# Patient Record
Sex: Female | Born: 1960 | Race: White | Hispanic: No | Smoking: Current every day smoker
Health system: Southern US, Community
[De-identification: ages and names within clinical notes are randomized; demographics above are authoritative.]

## PROBLEM LIST (undated history)

## (undated) DIAGNOSIS — J45909 Unspecified asthma, uncomplicated: Secondary | ICD-10-CM

## (undated) DIAGNOSIS — G4700 Insomnia, unspecified: Secondary | ICD-10-CM

## (undated) DIAGNOSIS — R609 Edema, unspecified: Secondary | ICD-10-CM

## (undated) DIAGNOSIS — J449 Chronic obstructive pulmonary disease, unspecified: Secondary | ICD-10-CM

## (undated) DIAGNOSIS — E119 Type 2 diabetes mellitus without complications: Secondary | ICD-10-CM

## (undated) DIAGNOSIS — B351 Tinea unguium: Secondary | ICD-10-CM

## (undated) DIAGNOSIS — F419 Anxiety disorder, unspecified: Secondary | ICD-10-CM

## (undated) DIAGNOSIS — E663 Overweight: Secondary | ICD-10-CM

## (undated) HISTORY — DX: Unspecified asthma, uncomplicated: J45.909

## (undated) HISTORY — PX: FEMUR FRACTURE SURGERY: SHX633

## (undated) HISTORY — DX: Edema, unspecified: R60.9

## (undated) HISTORY — DX: Chronic obstructive pulmonary disease, unspecified: J44.9

## (undated) HISTORY — DX: Anxiety disorder, unspecified: F41.9

## (undated) HISTORY — DX: Tinea unguium: B35.1

## (undated) HISTORY — DX: Insomnia, unspecified: G47.00

## (undated) HISTORY — DX: Type 2 diabetes mellitus without complications: E11.9

## (undated) HISTORY — DX: Overweight: E66.3

## (undated) HISTORY — PX: REPLACEMENT TOTAL KNEE: SUR1224

---

## 2013-12-16 DIAGNOSIS — J449 Chronic obstructive pulmonary disease, unspecified: Secondary | ICD-10-CM | POA: Insufficient documentation

## 2014-09-23 DIAGNOSIS — Z96649 Presence of unspecified artificial hip joint: Secondary | ICD-10-CM | POA: Insufficient documentation

## 2014-10-01 DIAGNOSIS — G894 Chronic pain syndrome: Secondary | ICD-10-CM | POA: Insufficient documentation

## 2014-10-01 DIAGNOSIS — F1911 Other psychoactive substance abuse, in remission: Secondary | ICD-10-CM | POA: Insufficient documentation

## 2015-08-02 DIAGNOSIS — M79604 Pain in right leg: Secondary | ICD-10-CM | POA: Insufficient documentation

## 2016-05-14 DIAGNOSIS — L03114 Cellulitis of left upper limb: Secondary | ICD-10-CM

## 2016-05-14 HISTORY — DX: Cellulitis of left upper limb: L03.114

## 2017-06-24 ENCOUNTER — Ambulatory Visit: Payer: BLUE CROSS/BLUE SHIELD | Admitting: Dietician

## 2018-02-11 ENCOUNTER — Other Ambulatory Visit: Payer: Self-pay | Admitting: Family Medicine

## 2018-02-11 DIAGNOSIS — Z1231 Encounter for screening mammogram for malignant neoplasm of breast: Secondary | ICD-10-CM

## 2018-09-04 ENCOUNTER — Other Ambulatory Visit: Payer: Self-pay | Admitting: Physician Assistant

## 2018-09-04 DIAGNOSIS — Z1231 Encounter for screening mammogram for malignant neoplasm of breast: Secondary | ICD-10-CM

## 2019-10-13 ENCOUNTER — Other Ambulatory Visit: Payer: Self-pay | Admitting: Physician Assistant

## 2019-10-13 ENCOUNTER — Other Ambulatory Visit (HOSPITAL_COMMUNITY): Payer: Self-pay | Admitting: Physician Assistant

## 2019-10-13 ENCOUNTER — Ambulatory Visit: Payer: 59 | Admitting: Physician Assistant

## 2019-10-13 ENCOUNTER — Other Ambulatory Visit: Payer: Self-pay

## 2019-10-13 VITALS — BP 110/74 | HR 94 | Temp 98.8°F | Ht 62.0 in | Wt 196.8 lb

## 2019-10-13 DIAGNOSIS — I872 Venous insufficiency (chronic) (peripheral): Secondary | ICD-10-CM

## 2019-10-13 DIAGNOSIS — M79661 Pain in right lower leg: Secondary | ICD-10-CM

## 2019-10-13 DIAGNOSIS — R600 Localized edema: Secondary | ICD-10-CM

## 2019-10-13 DIAGNOSIS — M79662 Pain in left lower leg: Secondary | ICD-10-CM

## 2019-10-13 NOTE — Progress Notes (Signed)
   Subjective:    Patient ID: Ellen Butler, female    DOB: 1961-03-30, 58 y.o.   MRN: [id number]  HPI    Review of Systems     Objective:   Physical Exam        Assessment & Plan:

## 2019-10-13 NOTE — Progress Notes (Signed)
Hx of bilateral leg swelling, worsening in the last few months., Left foot pain x couple weeks, sharp pain.No known injuries. Sharp pain in left ear comes and goes.

## 2019-10-13 NOTE — Progress Notes (Signed)
   Subjective: Bilateral lower leg pain    Patient ID: Ellen Butler, female    DOB: 1960/12/28, 58 y.o.   MRN: [id number]  HPI Patient presents with 3 months of increasing calf pain with standing and ambulation.  Patient denies shortness of breath or chest pain.  Patient also state there is increasing dry flaky skin on the lower extremities.  Patient denies redness.  Patient has not been seen in this facility for over a year.   Review of Systems Negative except for complaint.    Objective:   Physical Exam No acute distress.  Patient is overweight.  Examination of the lower extremities shows bilateral edema and flaky skin.  Patient has bilateral mild to moderate guarding palpation of the calf.  Peripheral pulses are intact but hard to palpate.       Assessment & Plan: Bilateral leg pain.  Venous insufficiency.  Discussed with patient  rationale for obtaining baseline labs due to no recent labs in 2 years.  Patient also will be consulted for bilateral lower extremity ultrasounds.  Patient will be scheduled for complete physical exam.

## 2019-10-20 ENCOUNTER — Other Ambulatory Visit: Payer: Self-pay

## 2019-10-20 ENCOUNTER — Ambulatory Visit
Admission: RE | Admit: 2019-10-20 | Discharge: 2019-10-20 | Disposition: A | Discharge status: Home / Self Care | Payer: Managed Care, Other (non HMO) | Source: Ambulatory Visit | Attending: Physician Assistant | Admitting: Physician Assistant

## 2019-10-20 DIAGNOSIS — M79662 Pain in left lower leg: Secondary | ICD-10-CM | POA: Insufficient documentation

## 2019-10-20 DIAGNOSIS — M79661 Pain in right lower leg: Secondary | ICD-10-CM | POA: Diagnosis present

## 2019-10-20 DIAGNOSIS — R600 Localized edema: Secondary | ICD-10-CM

## 2019-10-21 ENCOUNTER — Encounter: Payer: Self-pay | Admitting: Physician Assistant

## 2019-10-22 ENCOUNTER — Telehealth: Payer: Self-pay

## 2019-10-22 NOTE — Telephone Encounter (Signed)
Patient contacted via telephone.  Patient reported she made appt to see PA Katrinka Blazing due to worsening leg swelling of her legs and she had to switch from ankle socks to knee highs and socks are pushing swelling to her thighs now and definitely noticeable lumps under her clothing where socks end.  She does not watch her salt intake and typically doesn't drink very much water.  I discussed recommendations of knee to thigh high stockings which can be purchased at [employer]/[facility] or [employer], elevating feet after she is done at work for the day, drinking water to keep urine pale clear yellow tinged.  Monitor for rash, hot spot on legs and if worsening pain despite plan of care seek re-evaluation especially if rash/hot spot/dyspnea/SOB at rest/chest pain sooner than currently scheduled appt. Patient denied all of the above symptoms at this time only bilateral leg swelling which has been ongoing and worsening over the past year.   Discussed with patient BMI above recommended 25 and I do recommend weight loss.  Discussed venous ultrasound did not show varicose veins or blood clot.  Patient encouraged to keep her lab and follow up office visit appts as last labs drawn 2017 at [facility].  Patient last evaluated by PA Katrinka Blazing in office but I am on call today for [facility] and patient notified of such.  Patient agreed with plan of care and had no further questions at this time.  She verbalized understanding of information.

## 2019-10-22 NOTE — Telephone Encounter (Signed)
Durward Parcel, PA-C (Interim Provider) saw patient in office on 10/13/2019 for venous (peripheral) insufficiency. Ordered Lower Extremity Doppler Ultrasound.    LE Venous Doppler Ultrasound performed 10/20/2019 & the report says no evidence of DVT.  Patient contacted & wanted to know what now?  Sent message to Durward Parcel, PA-C & he recommended support stockings but didn't give any specifics.  Dewayne Hatch

## 2019-10-22 NOTE — Telephone Encounter (Signed)
Durward Parcel, PA-C (Interim Provider) recommended support stockings but didn't give any specifics.
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
## 2019-11-04 ENCOUNTER — Other Ambulatory Visit: Payer: Self-pay

## 2019-11-04 ENCOUNTER — Ambulatory Visit: Payer: 59

## 2019-11-04 DIAGNOSIS — Z0000 Encounter for general adult medical examination without abnormal findings: Secondary | ICD-10-CM

## 2019-11-04 LAB — POCT URINALYSIS DIPSTICK
Bilirubin, UA: NEGATIVE
Blood, UA: NEGATIVE
Glucose, UA: POSITIVE — AB
Ketones, UA: NEGATIVE
Leukocytes, UA: NEGATIVE
Nitrite, UA: NEGATIVE
Protein, UA: NEGATIVE
Spec Grav, UA: 1.025 (ref 1.010–1.025)
Urobilinogen, UA: 0.2 E.U./dL
pH, UA: 6 (ref 5.0–8.0)

## 2019-11-04 NOTE — Progress Notes (Signed)
Patient is here today for pre physical labs and EKG. Patient is scheduled for a physical with Laurie Lee, PA-C on 11/11/19. 

## 2019-11-05 LAB — CMP12+LP+TP+TSH+6AC+CBC/D/PLT
ALT: 22 IU/L (ref 0–32)
AST: 17 IU/L (ref 0–40)
Albumin/Globulin Ratio: 1.7 (ref 1.2–2.2)
Albumin: 4 g/dL (ref 3.8–4.9)
Alkaline Phosphatase: 115 IU/L (ref 39–117)
BUN/Creatinine Ratio: 20 (ref 9–23)
BUN: 13 mg/dL (ref 6–24)
Basophils Absolute: 0 10*3/uL (ref 0.0–0.2)
Basos: 1 %
Bilirubin Total: 0.2 mg/dL (ref 0.0–1.2)
Calcium: 9.3 mg/dL (ref 8.7–10.2)
Chloride: 100 mmol/L (ref 96–106)
Chol/HDL Ratio: 4.7 ratio — ABNORMAL HIGH (ref 0.0–4.4)
Cholesterol, Total: 179 mg/dL (ref 100–199)
Creatinine, Ser: 0.66 mg/dL (ref 0.57–1.00)
EOS (ABSOLUTE): 0.2 10*3/uL (ref 0.0–0.4)
Eos: 2 %
Estimated CHD Risk: 1.2 times avg. — ABNORMAL HIGH (ref 0.0–1.0)
Free Thyroxine Index: 1.7 (ref 1.2–4.9)
GFR calc Af Amer: 114 mL/min/{1.73_m2} (ref >59)
GFR calc non Af Amer: 99 mL/min/{1.73_m2} (ref >59)
GGT: 16 IU/L (ref 0–60)
Globulin, Total: 2.3 g/dL (ref 1.5–4.5)
Glucose: 288 mg/dL — ABNORMAL HIGH (ref 65–99)
HDL: 38 mg/dL — ABNORMAL LOW (ref >39)
Hematocrit: 40.7 % (ref 34.0–46.6)
Hemoglobin: 14.1 g/dL (ref 11.1–15.9)
Immature Grans (Abs): 0 10*3/uL (ref 0.0–0.1)
Immature Granulocytes: 0 %
Iron: 95 ug/dL (ref 27–159)
LDH: 191 IU/L (ref 119–226)
LDL Chol Calc (NIH): 103 mg/dL — ABNORMAL HIGH (ref 0–99)
Lymphocytes Absolute: 2.4 10*3/uL (ref 0.7–3.1)
Lymphs: 31 %
MCH: 31 pg (ref 26.6–33.0)
MCHC: 34.6 g/dL (ref 31.5–35.7)
MCV: 90 fL (ref 79–97)
Monocytes Absolute: 0.5 10*3/uL (ref 0.1–0.9)
Monocytes: 7 %
Neutrophils Absolute: 4.5 10*3/uL (ref 1.4–7.0)
Neutrophils: 59 %
Phosphorus: 3.5 mg/dL (ref 3.0–4.3)
Platelets: 243 10*3/uL (ref 150–450)
Potassium: 4.4 mmol/L (ref 3.5–5.2)
RBC: 4.55 x10E6/uL (ref 3.77–5.28)
RDW: 12.1 % (ref 11.7–15.4)
Sodium: 137 mmol/L (ref 134–144)
T3 Uptake Ratio: 27 % (ref 24–39)
T4, Total: 6.2 ug/dL (ref 4.5–12.0)
TSH: 2.56 u[IU]/mL (ref 0.450–4.500)
Total Protein: 6.3 g/dL (ref 6.0–8.5)
Triglycerides: 221 mg/dL — ABNORMAL HIGH (ref 0–149)
Uric Acid: 2.7 mg/dL — ABNORMAL LOW (ref 3.0–7.2)
VLDL Cholesterol Cal: 38 mg/dL (ref 5–40)
WBC: 7.8 10*3/uL (ref 3.4–10.8)

## 2019-11-07 LAB — HGB A1C W/O EAG: Hgb A1c MFr Bld: 9.6 % — ABNORMAL HIGH (ref 4.8–5.6)

## 2019-11-07 LAB — MICROALBUMIN / CREATININE URINE RATIO
Creatinine, Urine: 97.2 mg/dL
Microalb/Creat Ratio: 8 mg/g creat (ref 0–29)
Microalbumin, Urine: 7.7 ug/mL

## 2019-11-07 LAB — SPECIMEN STATUS REPORT

## 2019-11-11 ENCOUNTER — Other Ambulatory Visit: Payer: Self-pay

## 2019-11-11 ENCOUNTER — Ambulatory Visit: Payer: 59 | Admitting: Physician Assistant

## 2019-11-11 VITALS — BP 102/78 | HR 77 | Temp 98.1°F | Ht 62.0 in | Wt 200.0 lb

## 2019-11-11 DIAGNOSIS — F17200 Nicotine dependence, unspecified, uncomplicated: Secondary | ICD-10-CM

## 2019-11-11 DIAGNOSIS — Z0000 Encounter for general adult medical examination without abnormal findings: Secondary | ICD-10-CM

## 2019-11-11 DIAGNOSIS — R739 Hyperglycemia, unspecified: Secondary | ICD-10-CM

## 2019-11-11 NOTE — Progress Notes (Signed)
Subjective:    Patient ID: Ellen Butler, female    DOB: 1961/04/12, 59 y.o.   MRN: [id number]  HPI 59 yo F present for annual exam Husband died 8 years ago Covid restrictions- lonely  Has a dog she walks daily- only exercise Just met neighbor who also has dog--covid precautions but  Walk daily for comradery possibly  Smokes 1 ppd at least x many years- Has albuterol but hasn't used in years  Drinks "at least 10" Ghirardelli mocha chocolate drinks daily Cheesburgers from [employer]  Minimal fruits and vegies  Works at [employer] maybe 3-4 years Pap many many years ago  Hx drugs- Buprenorphine HCL-Naloxone HCL  5.7-1.4 mg subling  Lungs- has not used albuterol in years-probably need new Rx- check exp date  Glucose- 288- reported as fasting but with closer questioning she had a hot chocolate at 4 am and coffee with sweet creamer before reporting for labs   + glucosuria Hgb A1C   9.6  Triglycerides 221 HDL 38  LDL 103  Ratio 4.7  Elevated  Anxiety- states she has had Rx #30 Alprazolam  0.25 that has lasted "for a year"   Denies using Prozac anymore  Review of Systems Swelling lower legs Discomfort recently - better today per patient    Recent u/s no evidence of DVT bilat Objective:   Physical Exam Vitals and nursing note reviewed.  Constitutional:      Appearance: Normal appearance.     Comments: masked  HENT:     Head: Normocephalic and atraumatic.     Right Ear: Tympanic membrane normal.     Left Ear: Tympanic membrane normal.     Nose: Nose normal.     Mouth/Throat:     Mouth: Mucous membranes are moist.     Comments: Voice raspy No dental care Eyes:     Extraocular Movements: Extraocular movements intact.     Pupils: Pupils are equal, round, and reactive to light.     Comments: No eye exam in years- encouraged  Cardiovascular:     Rate and Rhythm: Normal rate and regular rhythm.     Pulses: Normal pulses.     Heart  sounds: Normal heart sounds.     Comments: EKG RSR Left axis - ant fascicular block Non-specific T wave -- low voltage  R/o pulmonary disease Pulmonary:     Effort: Pulmonary effort is normal. No respiratory distress.     Comments: Coarse breath sounds- Smokers voice Abdominal:     General: Abdomen is flat. Bowel sounds are normal.     Palpations: Abdomen is soft.     Tenderness: There is no abdominal tenderness.  Genitourinary:    Comments: Defer-recommend GYN routine visit Pap/pelvic Musculoskeletal:        General: Swelling present. Normal range of motion.     Cervical back: Normal range of motion.     Comments: Past hx dependant edema on record Today very mild 1 + bilat  Skin:    General: Skin is warm and dry.     Capillary Refill: Capillary refill takes less than 2 seconds.  Neurological:     General: No focal deficit present.     Mental Status: She is alert.  Psychiatric:        Mood and Affect: Mood normal.        Thought Content: Thought content normal.       Assessment & Plan:   RTC 2 weeks with repeat FBS  and A1C... true fast from midnight- please remind patient  Encourage walking plan with [place]-respecting masks and distance- 10 min 3 x day or 30 min  As preferred Use workplace parking lot and property for walking before home alone  Rec start q 6 mos DDS - job benefit no charge  Reduce tobacco- taught buy back method; half cig stop- Bring inhaler to next visit ck dates  Repeat EKG on RTC-  Dietary review and handouts- weight loss 1/2 ppwk goal- Check POCT A1C at follow up- if without change expect intervention indicated RX  Consider mammo order at follow up-attempterd to schedule1/19 - patient failed to f/u

## 2019-11-11 NOTE — Patient Instructions (Signed)
Smoking and Musculoskeletal Health Smoking is bad for your health. Most people know that smoking causes lung disease, heart disease, and cancer. But people may not realize that it also affects their bones, muscles, and joints (musculoskeletal system). When you smoke, the effects on your lungs and heart result in less oxygen for your musculoskeletal system. This can lead to poor bone and joint health. How can smoking affect my musculoskeletal health? Smoking can:  Increase your risk of having weak, thin bones (osteoporosis). Elderly smokers are at higher risk for bone fractures related to osteoporosis.  Decrease the ability of bone-forming cells to make and replace bone (in addition to reducing oxygen and blood flow).  Reduce your body's ability to absorb calcium from your diet. Less calcium means weaker bones.  Interfere with the breakdown of the female hormone estrogen. Smoking lowers estrogen, which is a hormone that helps keep bones strong. Women who smoke may have earlier menopause. Menopause is a risk factor for osteoporosis.  Weaken the tissues that attach bones to muscles (tendons). This can lead to shoulder, back, and other joint injuries.  Increase your risk of rheumatoid arthritis or make the condition worse if you already have it.  Slow down healing and increase your risk of infection and other complications if you have a bone fracture or surgery that involves your musculoskeletal system.  Make you get out of breath easily. This can keep you from getting the exercise you need to keep your bones and joints healthy.  Decrease your appetite and body mass. You may lose weight and muscle strength. This can put you at higher risk for muscle injury, joint injury, and broken bones. What actions can I take to prevent musculoskeletal problems? Quit smoking      Do not start smoking. Quit if you already do. Even stopping later in life can improve musculoskeletal health.  Do not use any  products that contain nicotine or tobacco. Do not replace cigarette smoking with e-cigarettes. The safety of e-cigarettes is not known, and some may contain harmful chemicals.  Make a plan to quit smoking and commit to it. Look for programs to help you, and ask your health care provider for recommendations and ideas.  Talk with your health care provider about using nicotine replacement medicines to help you quit, such as gum, lozenges, patches, sprays, or pills. Make other lifestyle changes   Eat a healthy diet that includes calcium and vitamin D. These nutrients are important for bone health. ? Calcium is found in dairy foods and green leafy vegetables. ? Vitamin D is found in eggs, fish, and liver. ? Many foods also have vitamin D and calcium added to them (are fortified). ? Ask your health care provider if you would benefit from taking a supplement.  Get out in the sunshine for a short time every day. This increases production of vitamin D.  Get 30 minutes of exercise at least 5 days a week. Weight-bearing and strength exercises are best for musculoskeletal health. Ask your health care provider what type of exercise is safe for you.  Do not drink alcohol if: ? Your health care provider tells you not to drink. ? You are pregnant, may be pregnant, or are planning to become pregnant.  If you drink alcohol, limit how much you have: ? 0-1 drink a day for women. ? 0-2 drinks a day for men.  Be aware of how much alcohol is in your drink. In the [place] one drink equals one 12 oz bottle   of beer (355 mL), one 5 oz glass of wine (148 mL), or one 1 oz glass of hard liquor (44 mL). Where to find more information You may find more information about smoking, musculoskeletal health, and quitting smoking from:  American Academy of Orthopaedic Surgeons: orthoinfo.aaos.org  [employer] of Health, Osteoporosis and Related Bone Diseases [employer]: bones.http://www.myers.net/  HelpGuide.org:  helpguide.org  BankR[employer]: smokefree.gov  American Lung Association: lung.org Contact a health care provider if:  You need help to quit smoking. Summary  When you smoke, the effects on your lungs and heart result in less oxygen for your musculoskeletal system.  Even stopping smoking later in life can improve musculoskeletal health.  Do not use any products that contain nicotine or tobacco, such as cigarettes and e-cigarettes.  If you need help quitting, ask your health care provider. This information is not intended to replace advice given to you by your health care provider. Make sure you discuss any questions you have with your health care provider. Document Revised: 06/25/2019 Document Reviewed: 01/26/2018 Elsevier Patient Education  2020 [employer]. Diabetes Mellitus and Nutrition, Adult When you have diabetes (diabetes mellitus), it is very important to have healthy eating habits because your blood sugar (glucose) levels are greatly affected by what you eat and drink. Eating healthy foods in the appropriate amounts, at about the same times every day, can help you:  Control your blood glucose.  Lower your risk of heart disease.  Improve your blood pressure.  Reach or maintain a healthy weight. Every person with diabetes is different, and each person has different needs for a meal plan. Your health care provider may recommend that you work with a diet and nutrition specialist (dietitian) to make a meal plan that is best for you. Your meal plan may vary depending on factors such as:  The calories you need.  The medicines you take.  Your weight.  Your blood glucose, blood pressure, and cholesterol levels.  Your activity level.  Other health conditions you have, such as heart or kidney disease. How do carbohydrates affect me? Carbohydrates, also called carbs, affect your blood glucose level more than any other type of food. Eating carbs naturally raises the amount of  glucose in your blood. Carb counting is a method for keeping track of how many carbs you eat. Counting carbs is important to keep your blood glucose at a healthy level, especially if you use insulin or take certain oral diabetes medicines. It is important to know how many carbs you can safely have in each meal. This is different for every person. Your dietitian can help you calculate how many carbs you should have at each meal and for each snack. Foods that contain carbs include:  Bread, cereal, rice, pasta, and crackers.  Potatoes and corn.  Peas, beans, and lentils.  Milk and yogurt.  Fruit and juice.  Desserts, such as cakes, cookies, ice cream, and candy. How does alcohol affect me? Alcohol can cause a sudden decrease in blood glucose (hypoglycemia), especially if you use insulin or take certain oral diabetes medicines. Hypoglycemia can be a life-threatening condition. Symptoms of hypoglycemia (sleepiness, dizziness, and confusion) are similar to symptoms of having too much alcohol. If your health care provider says that alcohol is safe for you, follow these guidelines:  Limit alcohol intake to no more than 1 drink per day for nonpregnant women and 2 drinks per day for men. One drink equals 12 oz of beer, 5 oz of wine, or 1  oz of hard liquor.  Do not drink on an empty stomach.  Keep yourself hydrated with water, diet soda, or unsweetened iced tea.  Keep in mind that regular soda, juice, and other mixers may contain a lot of sugar and must be counted as carbs. What are tips for following this plan?  Reading food labels  Start by checking the serving size on the "Nutrition Facts" label of packaged foods and drinks. The amount of calories, carbs, fats, and other nutrients listed on the label is based on one serving of the item. Many items contain more than one serving per package.  Check the total grams (g) of carbs in one serving. You can calculate the number of servings of carbs  in one serving by dividing the total carbs by 15. For example, if a food has 30 g of total carbs, it would be equal to 2 servings of carbs.  Check the number of grams (g) of saturated and trans fats in one serving. Choose foods that have low or no amount of these fats.  Check the number of milligrams (mg) of salt (sodium) in one serving. Most people should limit total sodium intake to less than 2,300 mg per day.  Always check the nutrition information of foods labeled as "low-fat" or "nonfat". These foods may be higher in added sugar or refined carbs and should be avoided.  Talk to your dietitian to identify your daily goals for nutrients listed on the label. Shopping  Avoid buying canned, premade, or processed foods. These foods tend to be high in fat, sodium, and added sugar.  Shop around the outside edge of the grocery store. This includes fresh fruits and vegetables, bulk grains, fresh meats, and fresh dairy. Cooking  Use low-heat cooking methods, such as baking, instead of high-heat cooking methods like deep frying.  Cook using healthy oils, such as olive, canola, or sunflower oil.  Avoid cooking with butter, cream, or high-fat meats. Meal planning  Eat meals and snacks regularly, preferably at the same times every day. Avoid going long periods of time without eating.  Eat foods high in fiber, such as fresh fruits, vegetables, beans, and whole grains. Talk to your dietitian about how many servings of carbs you can eat at each meal.  Eat 4-6 ounces (oz) of lean protein each day, such as lean meat, chicken, fish, eggs, or tofu. One oz of lean protein is equal to: ? 1 oz of meat, chicken, or fish. ? 1 egg. ?  cup of tofu.  Eat some foods each day that contain healthy fats, such as avocado, nuts, seeds, and fish. Lifestyle  Check your blood glucose regularly.  Exercise regularly as told by your health care provider. This may include: ? 150 minutes of moderate-intensity or  vigorous-intensity exercise each week. This could be brisk walking, biking, or water aerobics. ? Stretching and doing strength exercises, such as yoga or weightlifting, at least 2 times a week.  Take medicines as told by your health care provider.  Do not use any products that contain nicotine or tobacco, such as cigarettes and e-cigarettes. If you need help quitting, ask your health care provider.  Work with a Veterinary surgeon or diabetes educator to identify strategies to manage stress and any emotional and social challenges. Questions to ask a health care provider  Do I need to meet with a diabetes educator?  Do I need to meet with a dietitian?  What number can I call if I have questions?  When  are the best times to check my blood glucose? Where to find more information:  American Diabetes Association: diabetes.org  Academy of Nutrition and Dietetics: www.eatright.[employer] of Diabetes and Digestive and Kidney Diseases (NIH): [employer] Summary  A healthy meal plan will help you control your blood glucose and maintain a healthy lifestyle.  Working with a diet and nutrition specialist (dietitian) can help you make a meal plan that is best for you.  Keep in mind that carbohydrates (carbs) and alcohol have immediate effects on your blood glucose levels. It is important to count carbs and to use alcohol carefully. This information is not intended to replace advice given to you by your health care provider. Make sure you discuss any questions you have with your health care provider. Document Revised: 09/12/2017 Document Reviewed: 11/04/2016 Elsevier Patient Education  [place] Heart-healthy meal planning includes:  Eating less unhealthy fats.  Eating more healthy fats.  Making other changes in your diet. Talk with your doctor or a diet specialist (dietitian) to create an eating plan that is right for you. What is my  plan? Your doctor may recommend an eating plan that includes:  Total fat: ______% or less of total calories a day.  Saturated fat: ______% or less of total calories a day.  Cholesterol: less than _________mg a day. What are tips for following this plan? Cooking Avoid frying your food. Try to bake, boil, grill, or broil it instead. You can also reduce fat by:  Removing the skin from poultry.  Removing all visible fats from meats.  Steaming vegetables in water or broth. Meal planning   At meals, divide your plate into four equal parts: ? Fill one-half of your plate with vegetables and green salads. ? Fill one-fourth of your plate with whole grains. ? Fill one-fourth of your plate with lean protein foods.  Eat 4-5 servings of vegetables per day. A serving of vegetables is: ? 1 cup of raw or cooked vegetables. ? 2 cups of raw leafy greens.  Eat 4-5 servings of fruit per day. A serving of fruit is: ? 1 medium whole fruit. ?  cup of dried fruit. ?  cup of fresh, frozen, or canned fruit. ?  cup of 100% fruit juice.  Eat more foods that have soluble fiber. These are apples, broccoli, carrots, beans, peas, and barley. Try to get 20-30 g of fiber per day.  Eat 4-5 servings of nuts, legumes, and seeds per week: ? 1 serving of dried beans or legumes equals  cup after being cooked. ? 1 serving of nuts is  cup. ? 1 serving of seeds equals 1 tablespoon. General information  Eat more home-cooked food. Eat less restaurant, buffet, and fast food.  Limit or avoid alcohol.  Limit foods that are high in starch and sugar.  Avoid fried foods.  Lose weight if you are overweight.  Keep track of how much salt (sodium) you eat. This is important if you have high blood pressure. Ask your doctor to tell you more about this.  Try to add vegetarian meals each week. Fats  Choose healthy fats. These include olive oil and canola oil, flaxseeds, walnuts, almonds, and seeds.  Eat more  omega-3 fats. These include salmon, mackerel, sardines, tuna, flaxseed oil, and ground flaxseeds. Try to eat fish at least 2 times each week.  Check food labels. Avoid foods with trans fats or high amounts of saturated fat.  Limit saturated fats. ? These are  often found in animal products, such as meats, butter, and cream. ? These are also found in plant foods, such as palm oil, palm kernel oil, and coconut oil.  Avoid foods with partially hydrogenated oils in them. These have trans fats. Examples are stick margarine, some tub margarines, cookies, crackers, and other baked goods. What foods can I eat? Fruits All fresh, canned (in natural juice), or frozen fruits. Vegetables Fresh or frozen vegetables (raw, steamed, roasted, or grilled). Green salads. Grains Most grains. Choose whole wheat and whole grains most of the time. Rice and pasta, including brown rice and pastas made with whole wheat. Meats and other proteins Lean, well-trimmed beef, veal, pork, and lamb. Chicken and [place] without skin. All fish and shellfish. Wild duck, rabbit, pheasant, and venison. Egg whites or low-cholesterol egg substitutes. Dried beans, peas, lentils, and tofu. Seeds and most nuts. Dairy Low-fat or nonfat cheeses, including ricotta and mozzarella. Skim or 1% milk that is liquid, powdered, or evaporated. Buttermilk that is made with low-fat milk. Nonfat or low-fat yogurt. Fats and oils Non-hydrogenated (trans-free) margarines. Vegetable oils, including soybean, sesame, sunflower, olive, peanut, safflower, corn, canola, and cottonseed. Salad dressings or mayonnaise made with a vegetable oil. Beverages Mineral water. Coffee and tea. Diet carbonated beverages. Sweets and desserts Sherbet, gelatin, and fruit ice. Small amounts of dark chocolate. Limit all sweets and desserts. Seasonings and condiments All seasonings and condiments. The items listed above may not be a complete list of foods and drinks you can  eat. Contact a dietitian for more options. What foods should I avoid? Fruits Canned fruit in heavy syrup. Fruit in cream or butter sauce. Fried fruit. Limit coconut. Vegetables Vegetables cooked in cheese, cream, or butter sauce. Fried vegetables. Grains Breads that are made with saturated or trans fats, oils, or whole milk. Croissants. Sweet rolls. Donuts. High-fat crackers, such as cheese crackers. Meats and other proteins Fatty meats, such as hot dogs, ribs, sausage, bacon, rib-eye roast or steak. High-fat deli meats, such as salami and bologna. Caviar. Domestic duck and goose. Organ meats, such as liver. Dairy Cream, sour cream, cream cheese, and creamed cottage cheese. Whole-milk cheeses. Whole or 2% milk that is liquid, evaporated, or condensed. Whole buttermilk. Cream sauce or high-fat cheese sauce. Yogurt that is made from whole milk. Fats and oils Meat fat, or shortening. Cocoa butter, hydrogenated oils, palm oil, coconut oil, palm kernel oil. Solid fats and shortenings, including bacon fat, salt pork, lard, and butter. Nondairy cream substitutes. Salad dressings with cheese or sour cream. Beverages Regular sodas and juice drinks with added sugar. Sweets and desserts Frosting. Pudding. Cookies. Cakes. Pies. Milk chocolate or white chocolate. Buttered syrups. Full-fat ice cream or ice cream drinks. The items listed above may not be a complete list of foods and drinks to avoid. Contact a dietitian for more information. Summary  Heart-healthy meal planning includes eating less unhealthy fats, eating more healthy fats, and making other changes in your diet.  Eat a balanced diet. This includes fruits and vegetables, low-fat or nonfat dairy, lean protein, nuts and legumes, whole grains, and heart-healthy oils and fats. This information is not intended to replace advice given to you by your health care provider. Make sure you discuss any questions you have with your health care  provider. Document Revised: 12/04/2017 Document Reviewed: 11/07/2017 Elsevier Patient Education  2020 [employer].

## 2019-11-18 ENCOUNTER — Other Ambulatory Visit: Payer: 59

## 2019-11-18 ENCOUNTER — Other Ambulatory Visit: Payer: Self-pay

## 2019-11-18 DIAGNOSIS — R7309 Other abnormal glucose: Secondary | ICD-10-CM

## 2019-11-18 NOTE — Progress Notes (Signed)
Patient comes in today for a fasting glucose and A1C.

## 2019-11-19 LAB — HGB A1C W/O EAG: Hgb A1c MFr Bld: 10 % — ABNORMAL HIGH (ref 4.8–5.6)

## 2019-11-19 LAB — GLUCOSE, RANDOM

## 2019-11-25 ENCOUNTER — Ambulatory Visit: Payer: 59 | Admitting: Physician Assistant

## 2019-11-25 ENCOUNTER — Encounter: Payer: Self-pay | Admitting: Physician Assistant

## 2019-11-25 ENCOUNTER — Other Ambulatory Visit: Payer: Self-pay

## 2019-11-25 VITALS — BP 126/82 | HR 91 | Temp 90.0°F | Resp 16 | Ht 61.75 in | Wt 200.0 lb

## 2019-11-25 DIAGNOSIS — E1165 Type 2 diabetes mellitus with hyperglycemia: Secondary | ICD-10-CM

## 2019-11-25 DIAGNOSIS — F17200 Nicotine dependence, unspecified, uncomplicated: Secondary | ICD-10-CM

## 2019-11-25 MED ORDER — METFORMIN HCL 500 MG PO TABS: ORAL_TABLET | ORAL | 0 refills | Status: DC

## 2019-11-25 NOTE — Progress Notes (Signed)
  Subjective:     Patient ID: Ellen Butler, female   DOB: 1960-10-16, 59 y.o.   MRN: [id number]  HPI 59 yo F recently noted to have elevated A1C and random sugars. She insisted it was because she was drinking 10 and more  Ghiardelli Cocoa mixes every day as well as sweetened coffee creamers and "anything else I feel like eating" even doing so in the hours before her presumed fasting bloodwork.  Weight has increased to 200 pounds at  5'1 3/4 " Repeat labs revealed A1C of 10 ( up from previous 9.6) Lab glucose order cancelled -specimen n/a  Chart review revealed that in 2018 the PCP identified DM2 and scheduled her for nutrition  counselling which she failed to attend - now stating it would cost $500 that she didn't have available.  Bringing her back for face to face today we addressed these issues. Denial will not make disease or its complications go away She reveals that her mom is an insulin dependant DM that takes poor care of herself. Ellen Butler's aunt /her mom's sister is also insulin dependant DM. They have glucometers and patient  education available "but nobody uses it"   Review of Systems  non-contributory    Objective:   Physical Exam Vitals and nursing note reviewed.       Assessment:      DM2  , uncontrolled  Plan:        [name] agrees to initiate po Metformin-  500 mg QAM w meal  x 5 days, then if well tolerated will add 500 mg at evening meal.  Given multiple handouts on diet ideas and serving sizes.  Will have nursing get details on nutritional counsel service  Bring back for f/u in close succession in hopes of keeping her focused and trying to make changes . Emphasized that she has to take ownership and address- she is not pleased with her mom's state of health and is encouraged to be pro-active  Note- [facility] [place]  [phone]

## 2019-11-25 NOTE — Progress Notes (Signed)
Breakfast:  Pita with egg & cheese; Cup of coffee w/non-dairy creamer   Lunch: Protein Shake; Unsweetened Tea  Snack :  3 wheat thins & 5 pieces of cheddar cheese  Snack : Thin Additives - 3 cookies (100 cal)  AMD

## 2019-12-16 ENCOUNTER — Ambulatory Visit: Payer: Managed Care, Other (non HMO)

## 2019-12-27 ENCOUNTER — Encounter: Payer: Self-pay | Admitting: Physician Assistant

## 2019-12-27 ENCOUNTER — Other Ambulatory Visit: Payer: Self-pay | Admitting: Physician Assistant

## 2019-12-27 ENCOUNTER — Ambulatory Visit: Payer: Self-pay | Admitting: Physician Assistant

## 2019-12-27 ENCOUNTER — Other Ambulatory Visit: Payer: Self-pay

## 2019-12-27 VITALS — BP 150/74 | HR 91 | Temp 98.0°F | Resp 16 | Ht 62.0 in | Wt 195.0 lb

## 2019-12-27 DIAGNOSIS — E1165 Type 2 diabetes mellitus with hyperglycemia: Secondary | ICD-10-CM

## 2019-12-27 DIAGNOSIS — Z23 Encounter for immunization: Secondary | ICD-10-CM

## 2019-12-27 LAB — POCT URINALYSIS DIPSTICK
Bilirubin, UA: NEGATIVE
Blood, UA: NEGATIVE
Glucose, UA: NEGATIVE
Ketones, UA: NEGATIVE
Leukocytes, UA: NEGATIVE
Nitrite, UA: NEGATIVE
Protein, UA: NEGATIVE
Spec Grav, UA: 1.015 (ref 1.010–1.025)
Urobilinogen, UA: 0.2 E.U./dL
pH, UA: 7 (ref 5.0–8.0)

## 2019-12-27 LAB — POCT GLYCOSYLATED HEMOGLOBIN (HGB A1C): HbA1c, POC (controlled diabetic range): 7.8 % — AB (ref 0.0–7.0)

## 2019-12-27 MED ORDER — BLOOD GLUCOSE MONITOR KIT: PACK | 0 refills | Status: AC

## 2019-12-27 MED ORDER — GLUCOSE BLOOD VI STRP: ORAL_STRIP | 12 refills | Status: DC

## 2019-12-27 NOTE — Progress Notes (Signed)
   Subjective: Diabetes    Patient ID: Ellen Butler, female    DOB: 1961/04/09, 59 y.o.   MRN: [id number]  HPI Patient here for follow-up status post recent diagnosis of diabetes type 2.  Patient was started on Metformin 500 daily for 2 weeks and was told to increase to 500 twice daily.  Patient state she tried the twice daily dosage for 2 days but did not like the side effects.  Patient states he went back to taking 500 mg.  Patient does not have a glucometer to measure her levels.  Patient hemoglobin A1c was (10) on 11/18/2019.  Hemoglobin A1c today was 7.8.   Review of Systems Diabetes    Objective:   Physical Exam No acute distress.  HEENT was unremarkable.  Neck is supple for adenopathy or bruits.  Lungs clear to auscultation.  Heart regular rate and rhythm.       Assessment & Plan: Diabetes  Diabetes is not controlled this time.  Patient does not have a monitoring device.  Advised patient to continue Metformin 500 mg daily pending repeat labs.  Patient given his prescription for One Touch glucometer.  Patient will return back on December 30, 2019 for nurse education and reevaluation by PCP.

## 2019-12-27 NOTE — Progress Notes (Signed)
Metformin - tried taking bid x3 days & stopped because said it made her feel funny.  Hasn't been checking her blood sugar because she doesn't have a meter and or supplies.  Asking questions about the continuous glucose monitoring systems.  AMD

## 2019-12-30 ENCOUNTER — Ambulatory Visit: Payer: 59

## 2020-01-03 ENCOUNTER — Telehealth: Payer: Self-pay

## 2020-01-03 NOTE — Telephone Encounter (Signed)
Ellen Butler's A1c results from [facility] are 9.3.  Reviewed by Durward Parcel, PA-C & recommends increasing Metformin 500 mg to bid and to record BS every other day and follow-up in 1 month after being on bid dose.  Contacted Ellen Butler today.  She missed her appointment last Thursday to show her how to work glucose meter.  States she had not picked it up from pharmacy & that's why she missed the appointment.  Advised to increase Metformin to bid. Record BS every other day. Follow up in 1 month.  To come in tomorrow at 4:00 pm to show her how to use her glucose monitor.  AMD

## 2020-01-04 ENCOUNTER — Other Ambulatory Visit: Payer: Self-pay

## 2020-01-04 ENCOUNTER — Ambulatory Visit: Payer: Self-pay

## 2020-01-04 DIAGNOSIS — Z09 Encounter for follow-up examination after completed treatment for conditions other than malignant neoplasm: Secondary | ICD-10-CM

## 2020-01-04 NOTE — Progress Notes (Signed)
Patient brought in glucose monitor that her pharmacy provided - GE monitoring system.  Went over how to check BS. Went over S/S of low & high blood sugars. Increase Metformin 500 mg to bid Check BS every other day - fasting. Follow-up in 1 month.  Needs Rx refill of Metformin.  AMD

## 2020-01-05 ENCOUNTER — Other Ambulatory Visit: Payer: Self-pay

## 2020-01-05 ENCOUNTER — Other Ambulatory Visit: Payer: Self-pay | Admitting: Physician Assistant

## 2020-01-05 DIAGNOSIS — E1165 Type 2 diabetes mellitus with hyperglycemia: Secondary | ICD-10-CM

## 2020-01-05 MED ORDER — METFORMIN HCL 500 MG PO TABS: 500.0000 mg | ORAL_TABLET | Freq: Two times a day (BID) | ORAL | 2 refills | Status: DC

## 2020-01-14 LAB — RABIES NEUT.ABS TITRAT.(RFFIT)

## 2020-01-14 LAB — HGB A1C W/O EAG: Hgb A1c MFr Bld: 9.3 % — ABNORMAL HIGH (ref 4.8–5.6)

## 2020-01-21 ENCOUNTER — Telehealth: Payer: Self-pay

## 2020-01-21 DIAGNOSIS — E1165 Type 2 diabetes mellitus with hyperglycemia: Secondary | ICD-10-CM

## 2020-01-21 NOTE — Telephone Encounter (Signed)
Patient called clinic and reported cramping and diarrhea on each day she had taken metformin 500mg  1 tab two times that day.  Asked patient if it was metformin or metformin XR. Patient confirmed Metformin 500mg  1 tab twice daily.  Advised I would message  provider requesting Metformin Extended Release to [facility] to [facility] [place].

## 2020-01-26 MED ORDER — METFORMIN HCL ER 500 MG PO TB24: 1000.0000 mg | ORAL_TABLET | Freq: Every day | ORAL | 2 refills | Status: DC

## 2020-02-08 NOTE — Progress Notes (Signed)
Needs to schedule Follow-up appointment.  Follow-up labs - Anette Riedel, PA-C & Durward Parcel, PA-C have both been involved in [name]' care.    States XR Metformin is working better - able to tolerate better than the other metformin.  AMD

## 2020-02-09 ENCOUNTER — Other Ambulatory Visit: Payer: Self-pay

## 2020-02-09 VITALS — Wt 192.0 lb

## 2020-02-09 DIAGNOSIS — E1165 Type 2 diabetes mellitus with hyperglycemia: Secondary | ICD-10-CM

## 2020-02-10 LAB — CMP12+LP+TP+TSH+6AC+CBC/D/PLT
ALT: 16 IU/L (ref 0–32)
AST: 18 IU/L (ref 0–40)
Albumin/Globulin Ratio: 1.6 (ref 1.2–2.2)
Albumin: 4 g/dL (ref 3.8–4.9)
Alkaline Phosphatase: 99 IU/L (ref 39–117)
BUN/Creatinine Ratio: 19 (ref 9–23)
BUN: 12 mg/dL (ref 6–24)
Basophils Absolute: 0 10*3/uL (ref 0.0–0.2)
Basos: 0 %
Bilirubin Total: 0.2 mg/dL (ref 0.0–1.2)
Calcium: 9 mg/dL (ref 8.7–10.2)
Chloride: 101 mmol/L (ref 96–106)
Chol/HDL Ratio: 4.4 ratio (ref 0.0–4.4)
Cholesterol, Total: 163 mg/dL (ref 100–199)
Creatinine, Ser: 0.63 mg/dL (ref 0.57–1.00)
EOS (ABSOLUTE): 0.2 10*3/uL (ref 0.0–0.4)
Eos: 3 %
Estimated CHD Risk: 1.1 times avg. — ABNORMAL HIGH (ref 0.0–1.0)
Free Thyroxine Index: 2 (ref 1.2–4.9)
GFR calc Af Amer: 116 mL/min/{1.73_m2} (ref >59)
GFR calc non Af Amer: 101 mL/min/{1.73_m2} (ref >59)
GGT: 12 IU/L (ref 0–60)
Globulin, Total: 2.5 g/dL (ref 1.5–4.5)
Glucose: 158 mg/dL — ABNORMAL HIGH (ref 65–99)
HDL: 37 mg/dL — ABNORMAL LOW (ref >39)
Hematocrit: 41.4 % (ref 34.0–46.6)
Hemoglobin: 13.8 g/dL (ref 11.1–15.9)
Immature Grans (Abs): 0 10*3/uL (ref 0.0–0.1)
Immature Granulocytes: 0 %
Iron: 63 ug/dL (ref 27–159)
LDH: 218 IU/L (ref 119–226)
LDL Chol Calc (NIH): 97 mg/dL (ref 0–99)
Lymphocytes Absolute: 2.5 10*3/uL (ref 0.7–3.1)
Lymphs: 34 %
MCH: 30.5 pg (ref 26.6–33.0)
MCHC: 33.3 g/dL (ref 31.5–35.7)
MCV: 91 fL (ref 79–97)
Monocytes Absolute: 0.5 10*3/uL (ref 0.1–0.9)
Monocytes: 7 %
Neutrophils Absolute: 4 10*3/uL (ref 1.4–7.0)
Neutrophils: 56 %
Phosphorus: 3.7 mg/dL (ref 3.0–4.3)
Platelets: 243 10*3/uL (ref 150–450)
Potassium: 4.2 mmol/L (ref 3.5–5.2)
RBC: 4.53 x10E6/uL (ref 3.77–5.28)
RDW: 12.7 % (ref 11.7–15.4)
Sodium: 140 mmol/L (ref 134–144)
T3 Uptake Ratio: 29 % (ref 24–39)
T4, Total: 6.8 ug/dL (ref 4.5–12.0)
TSH: 2.71 u[IU]/mL (ref 0.450–4.500)
Total Protein: 6.5 g/dL (ref 6.0–8.5)
Triglycerides: 164 mg/dL — ABNORMAL HIGH (ref 0–149)
Uric Acid: 4.3 mg/dL (ref 3.0–7.2)
VLDL Cholesterol Cal: 29 mg/dL (ref 5–40)
WBC: 7.3 10*3/uL (ref 3.4–10.8)

## 2020-02-10 LAB — HGB A1C W/O EAG: Hgb A1c MFr Bld: 8.5 % — ABNORMAL HIGH (ref 4.8–5.6)

## 2020-02-17 ENCOUNTER — Ambulatory Visit: Payer: 59 | Admitting: Physician Assistant

## 2020-02-17 ENCOUNTER — Other Ambulatory Visit: Payer: Self-pay

## 2020-02-17 ENCOUNTER — Encounter: Payer: Self-pay | Admitting: Physician Assistant

## 2020-02-17 VITALS — BP 114/63 | HR 81 | Temp 97.7°F | Resp 16 | Ht 62.5 in | Wt 191.0 lb

## 2020-02-17 DIAGNOSIS — E1165 Type 2 diabetes mellitus with hyperglycemia: Secondary | ICD-10-CM

## 2020-02-17 NOTE — Progress Notes (Signed)
Review lab results collected on 02/09/20.  AMD

## 2020-02-17 NOTE — Progress Notes (Signed)
   Subjective: Diabetes follow-up    Patient ID: Ellen Butler, female    DOB: 1961-01-05, 59 y.o.   MRN: [id number]  HPI Patient presents for diabetes follow-up status post increase of Metformin from 500 mg daily to 500 mg twice daily.  Patient states she is now tolerating medicines without any GI upset.  Patient also stated has been a 7 pound weight loss since last visit.  Reviewed patient labs today showed a decrease in her hemoglobin A1c from 9.3 to 8.5.  Patient has been monitoring her blood sugars since obtaining glucometer.   Review of Systems    Anxiety and diabetes. Objective:   Physical Exam No acute distress.  HEENT is unremarkable.  Neck is supple for adenopathy or bruits.  Lungs are clear to auscultation.  Heart regular rate and rhythm.  Abdomen negative HSM, normoactive bowel sounds, soft, and nontender to palpation.       Assessment & Plan: Diabetes  Congratulated patient on her compliance with medication and weight loss.  Patient advised continue previous medications.  Patient will follow up in 6 months for reevaluation.

## 2020-04-13 ENCOUNTER — Telehealth: Payer: Self-pay

## 2020-04-13 ENCOUNTER — Other Ambulatory Visit: Payer: Self-pay

## 2020-04-13 ENCOUNTER — Telehealth: Payer: Self-pay | Admitting: Emergency Medicine

## 2020-04-13 ENCOUNTER — Ambulatory Visit: Payer: Self-pay

## 2020-04-13 DIAGNOSIS — Z23 Encounter for immunization: Secondary | ICD-10-CM

## 2020-04-13 DIAGNOSIS — S51852A Open bite of left forearm, initial encounter: Secondary | ICD-10-CM

## 2020-04-13 MED ORDER — AMOXICILLIN-POT CLAVULANATE 875-125 MG PO TABS: 1.0000 | ORAL_TABLET | Freq: Two times a day (BID) | ORAL | 0 refills | Status: AC

## 2020-04-13 NOTE — Telephone Encounter (Signed)
Patient with workman's comp related dog bite. Prescribed augmentin.

## 2020-04-13 NOTE — Telephone Encounter (Signed)
Pt works in Mining engineer.  Had dog bite injury. 3 small puncture wounds to L forearm, 1 small abrasion. Cleaned on-site. Triple Antibiotic ointment applied, dressed. 400 mg Motrin given. Tetanus updated.  Rabies current.  Pt is diabetic and smoker.  Need Antibiotic order sent to [facility] [place], [place]

## 2020-04-13 NOTE — Progress Notes (Signed)
Pt works for [employer] and was bitten by a dog while at work. 3 small puncture wounds to underside of left forearm, 1 small abrasion.  Cleaned on-site.  Triple antibiotic ointment, non-adherent dressings, and coban applied to left arm.  400 mg Ibuprofen given with water.  Tetanus updated.  Antibiotics sent to Pharmacy by Daryel November, MD.  Pt to return to clinic tomorrow, Friday, July 2nd for wound check.

## 2020-04-14 ENCOUNTER — Ambulatory Visit: Payer: Self-pay

## 2020-04-14 DIAGNOSIS — S51852D Open bite of left forearm, subsequent encounter: Secondary | ICD-10-CM

## 2020-04-14 NOTE — Progress Notes (Signed)
Patient in to clinic for wound check.  Reports taking antibiotic last night and this morning.  Reports tenderness with dressing change.  Slight redness and bruising noted. No swelling or drainage from wounds.  Triple antibiotic ointment applied, non-adherent pads and coban applied.  Patient instructed to call [facility] Nurse Manager over the holiday weekend if any problems.  Patient verbalized understanding.

## 2020-04-18 ENCOUNTER — Ambulatory Visit: Payer: Self-pay

## 2020-04-18 ENCOUNTER — Other Ambulatory Visit: Payer: Self-pay

## 2020-04-18 DIAGNOSIS — S51852D Open bite of left forearm, subsequent encounter: Secondary | ICD-10-CM

## 2020-04-18 NOTE — Progress Notes (Signed)
Cleansed dog bite puncture wounds to underside of left forearm with 1/2 strength H2O2, applied triple antibiotic ointment & applied non-stick dressing with coban to hole in place.  Wounds healing well.  Yellowish bruising still in some areas.  No S/Sx of infection.  AMD

## 2020-04-20 ENCOUNTER — Other Ambulatory Visit: Payer: Self-pay

## 2020-04-20 ENCOUNTER — Ambulatory Visit: Payer: Self-pay | Admitting: Emergency Medicine

## 2020-04-20 DIAGNOSIS — W540XXA Bitten by dog, initial encounter: Secondary | ICD-10-CM

## 2020-04-20 NOTE — Progress Notes (Signed)
Ellen Butler is here for dressing change from a dog bite that happened while at work.  Dr. Mayford Knife saw her earlier and she was prescribed Augmentin which she is not having any difficulty taking.  Area appears to be healing well without any signs of infection.  2 puncture wounds to the forearm.  Nontender to palpation.  Patient will finish the antibiotic and continue to return as needed.

## 2020-04-21 ENCOUNTER — Ambulatory Visit: Payer: Self-pay

## 2020-04-21 DIAGNOSIS — W540XXD Bitten by dog, subsequent encounter: Secondary | ICD-10-CM

## 2020-04-21 NOTE — Progress Notes (Signed)
Presents for wound recheck & dressing change to dogbite on left forearm.  Bruising almost gone.  4 puncture areas without S/Sx of infection.  Cleansed site with 1/2 strength H2O2 & applied triple antibiotic ointment along with non-stick dressing & coban.  AMD

## 2020-04-26 ENCOUNTER — Other Ambulatory Visit: Payer: Self-pay

## 2020-04-26 ENCOUNTER — Ambulatory Visit: Payer: Self-pay

## 2020-04-26 DIAGNOSIS — S51852D Open bite of left forearm, subsequent encounter: Secondary | ICD-10-CM

## 2020-04-26 NOTE — Progress Notes (Signed)
3 firm nodules noted beneath skin near bite wounds.  Durward Parcel, PA-C examined wound and requested ultrasound of forearm.  Orders placed and patient made aware.  Patient verbalized understanding of instructions.

## 2020-04-27 ENCOUNTER — Other Ambulatory Visit: Payer: Self-pay

## 2020-04-27 ENCOUNTER — Ambulatory Visit
Admission: RE | Admit: 2020-04-27 | Discharge: 2020-04-27 | Disposition: A | Discharge status: Home / Self Care | Payer: No Typology Code available for payment source | Source: Ambulatory Visit | Attending: Physician Assistant | Admitting: Physician Assistant

## 2020-04-27 DIAGNOSIS — S51852D Open bite of left forearm, subsequent encounter: Secondary | ICD-10-CM

## 2020-04-27 MED ORDER — NAPROXEN 500 MG PO TABS: 500.0000 mg | ORAL_TABLET | Freq: Two times a day (BID) | ORAL | 0 refills | Status: DC

## 2020-04-27 MED ORDER — SULFAMETHOXAZOLE-TRIMETHOPRIM 800-160 MG PO TABS: 1.0000 | ORAL_TABLET | Freq: Two times a day (BID) | ORAL | 0 refills | Status: AC

## 2020-04-27 NOTE — Addendum Note (Signed)
Addended by: Oda Cogan on: 04/27/2020 09:38 AM   Modules accepted: Orders

## 2020-04-27 NOTE — Progress Notes (Signed)
Antibiotics sent to Pharmacy.

## 2020-05-04 ENCOUNTER — Ambulatory Visit: Payer: 59

## 2020-07-18 ENCOUNTER — Encounter: Payer: Self-pay | Admitting: Emergency Medicine

## 2020-07-18 ENCOUNTER — Other Ambulatory Visit: Payer: Self-pay

## 2020-07-18 ENCOUNTER — Ambulatory Visit: Payer: Self-pay | Admitting: Emergency Medicine

## 2020-07-18 VITALS — BP 120/77 | HR 92 | Resp 16 | Ht 62.0 in | Wt 191.0 lb

## 2020-07-18 DIAGNOSIS — M25571 Pain in right ankle and joints of right foot: Secondary | ICD-10-CM

## 2020-07-18 NOTE — Progress Notes (Signed)
Pt presents today with lump on right foot x 5 days

## 2020-07-18 NOTE — Progress Notes (Signed)
I have reviewed the triage vital signs and the nursing notes.   HISTORY  Chief Complaint No chief complaint on file.  HPI Ellen Butler is a 59 y.o. female is here with complaint of right foot pain that started 5 days ago.          Past Medical History:  Diagnosis Date  . Anxiety   . Asthma   . Cellulitis of left arm 05/2016  . COPD (chronic obstructive pulmonary disease) ([place])   . Dependent edema   . Insomnia   . Onychomycosis   . Over weight     Patient Active Problem List   Diagnosis Date Noted  . Right leg pain 08/02/2015  . Hx of substance abuse ([place]) 10/01/2014  . Pain syndrome, chronic 10/01/2014  . 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 Chronic obstructive pulmonary disease, unspecified ([place]) 12/16/2013    Past Surgical History:  Procedure Laterality Date  . FEMUR FRACTURE SURGERY    . REPLACEMENT TOTAL KNEE      Prior to Admission medications   Medication Sig Start Date End Date Taking? Authorizing Provider  ALPRAZolam (XANAX) 0.25 MG tablet Take 0.25 mg by mouth daily as needed. 11/08/19  Yes [provider]  blood glucose meter kit and supplies KIT Dispense based on patient and insurance preference. Use up to four times daily as directed. (FOR ICD-9 250.00, 250.01). 12/27/19  Yes Sable Feil, PA-C  Buprenorphine HCl-Naloxone HCl 5.7-1.4 MG SUBL Place under the tongue.   Yes [provider]  glucose blood test strip Use as instructed 12/27/19  Yes Sable Feil, PA-C  metFORMIN (GLUCOPHAGE-XR) 500 MG 24 hr tablet Take 2 tablets (1,000 mg total) by mouth daily with breakfast. 01/26/20  Yes Sable Feil, PA-C  naproxen (NAPROSYN) 500 MG tablet Take 1 tablet (500 mg total) by mouth 2 (two) times daily with a meal. 04/27/20  Yes Sable Feil, PA-C  albuterol (VENTOLIN HFA) 108 (90 Base) MCG/ACT inhaler Inhale into the lungs. 11/29/13   [provider]    Allergies Patient has no known allergies.  Family  History  Problem Relation Age of Onset  . Parkinson's disease Mother   . Diabetes Mother   . Arthritis Mother   . Diabetes Maternal Aunt     Social History Social History   Tobacco Use  . Smoking status: Current Every Day Smoker  . Smokeless tobacco: Never Used  Substance Use Topics  . Alcohol use: Not on file  . Drug use: Not on file    Review of Systems Constitutional: No fever/chills Cardiovascular: Denies chest pain. Respiratory: Denies shortness of breath. Musculoskeletal: Positive right foot pain. Skin: Negative for rash. Neurological: Negative for  focal weakness or numbness. ____________________________________________   PHYSICAL EXAM:  Constitutional: Alert and oriented. Well appearing and in no acute distress. Eyes: Conjunctivae are normal.  Head: Atraumatic. Neck: No stridor.  Cardiovascular: Normal rate, regular rhythm. Grossly normal heart sounds.  Good peripheral circulation. Respiratory: Normal respiratory effort.  No retractions. Lungs CTAB. Musculoskeletal: On examination the right foot patient is flat-footed but no point tenderness is noted on palpation of the arch.  There is no erythema or edema present.  Large callus formation noted to the heel plantar aspect.  No tenderness is noted on palpation of the Achilles tendon and patient is able to flex and extend with no difficulties.  There is a small pinpoint area on the plantar aspect just before the callused area that is extremely tender to  palpation.  Questionable foreign body present.  Skin is closed and no drainage is present.  Pulses are present. Neurologic:  Normal speech and language.  Decreased sensation to right foot most likely due to diabetic neuropathy. Skin:  Skin is warm, dry and intact.  Review foot exam above. Psychiatric: Mood and affect are normal. Speech and behavior are normal.    FINAL CLINICAL IMPRESSION(S)   Acute right foot pain.  Right foot x-ray ordered for evaluation of this  area.  Also referral to podiatry if nothing is seen on x-ray and continued pain.  The podiatrist at [facility] also have hours at the clinic in [facility] which is closer to her home.   ED Discharge Orders    None      Note:  This document was prepared using Dragon voice recognition software and may include unintentional dictation errors.

## 2020-07-20 ENCOUNTER — Ambulatory Visit
Admission: RE | Admit: 2020-07-20 | Discharge: 2020-07-20 | Disposition: A | Discharge status: Home / Self Care | Payer: 59 | Attending: Emergency Medicine | Admitting: Emergency Medicine

## 2020-07-20 ENCOUNTER — Ambulatory Visit
Admission: RE | Admit: 2020-07-20 | Discharge: 2020-07-20 | Disposition: A | Discharge status: Home / Self Care | Payer: 59 | Source: Ambulatory Visit | Attending: Emergency Medicine | Admitting: Emergency Medicine

## 2020-07-20 ENCOUNTER — Other Ambulatory Visit: Payer: Self-pay

## 2020-07-20 DIAGNOSIS — M25571 Pain in right ankle and joints of right foot: Secondary | ICD-10-CM | POA: Diagnosis not present

## 2020-07-21 NOTE — Addendum Note (Signed)
Addended by: Gardner Candle on: 07/21/2020 03:22 PM   Modules accepted: Orders

## 2020-08-07 IMAGING — US US EXTREM LOW VENOUS
1 series · 13 of 24 positions shown · non-contrast
Comparison: None.

CLINICAL DATA: Bilateral lower extremity pain and edema. History of
smoking. Evaluate for DVT.



[Series 1: us extrem low venous · 0.09mm/px · 13 of 59 slices shown]
[im 1/59]
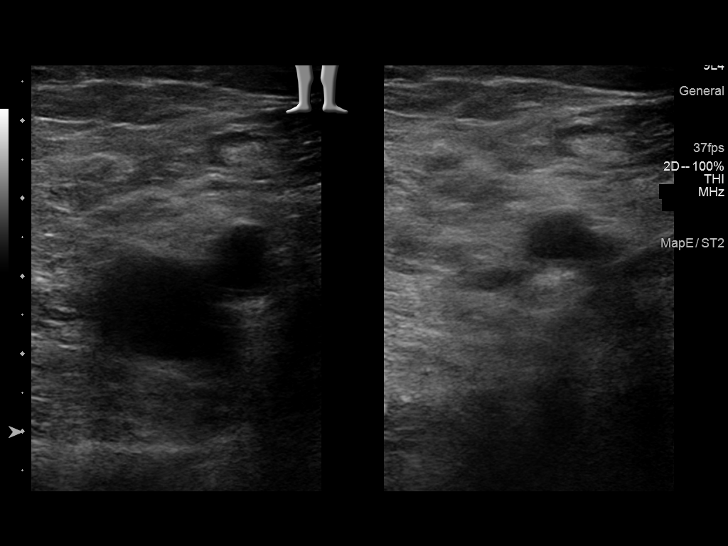
[im 6/59]
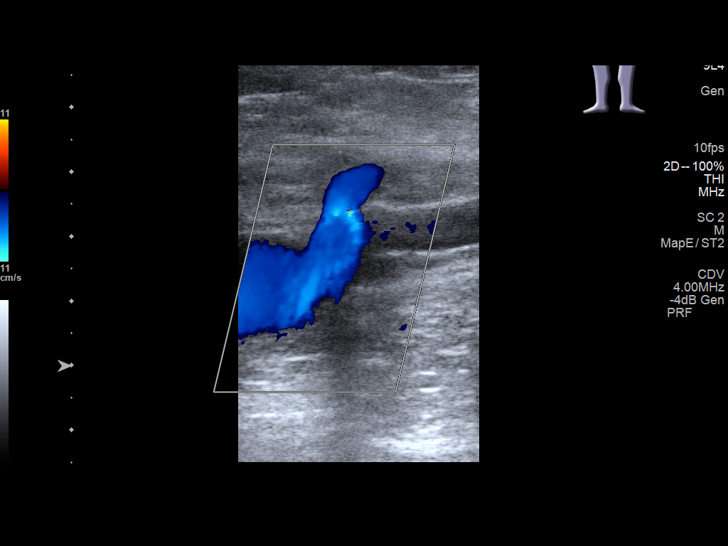
[im 11/59]
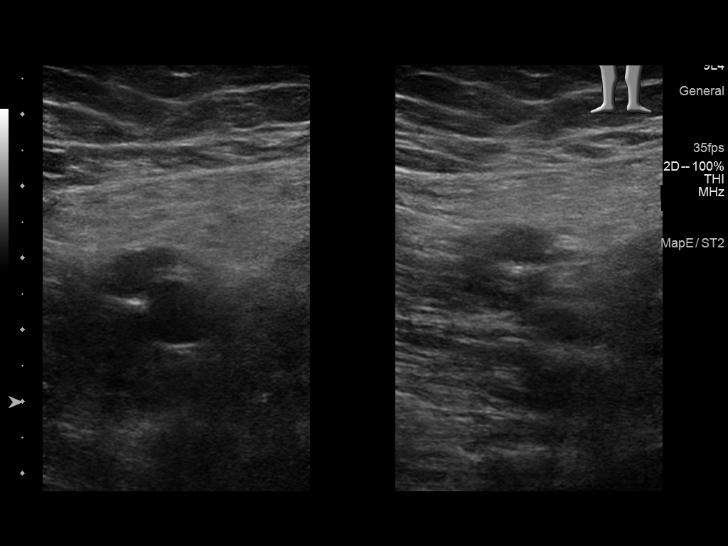
[im 16/59]
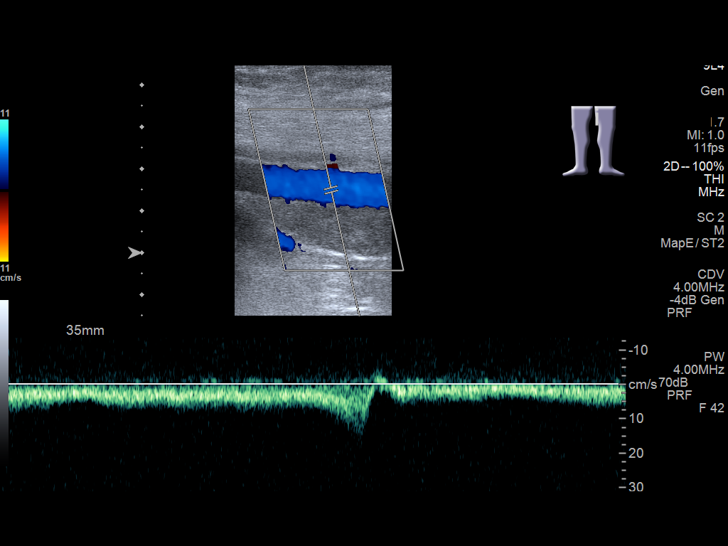
[im 21/59]
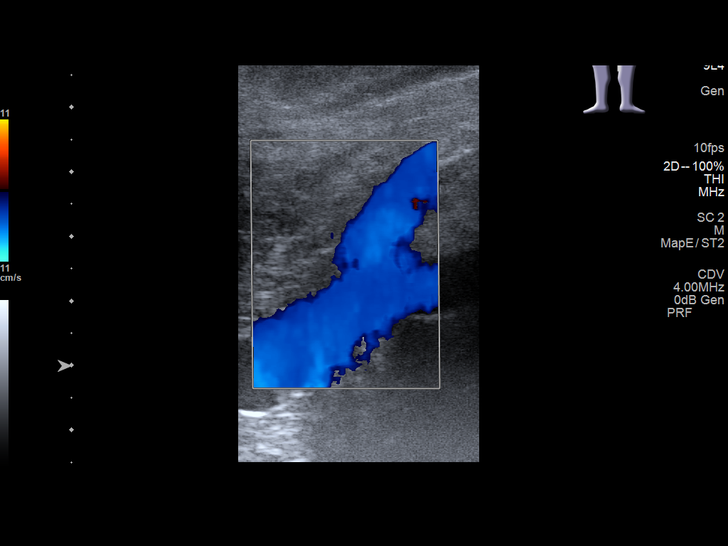
[im 26/59]
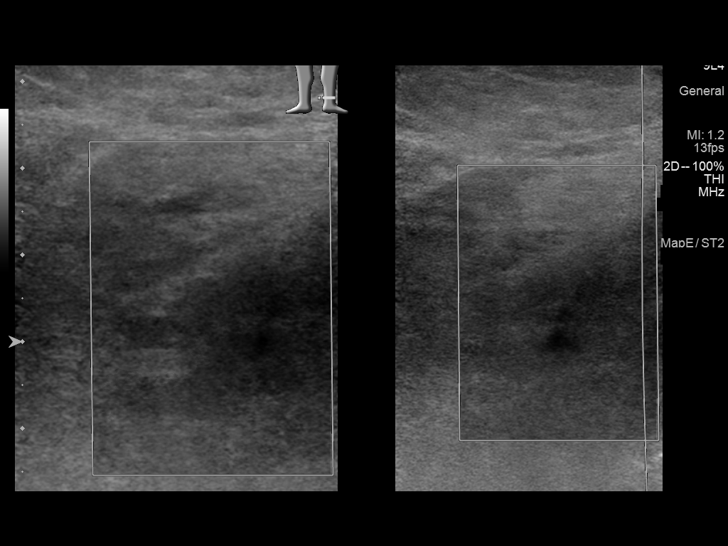
[im 31/59]
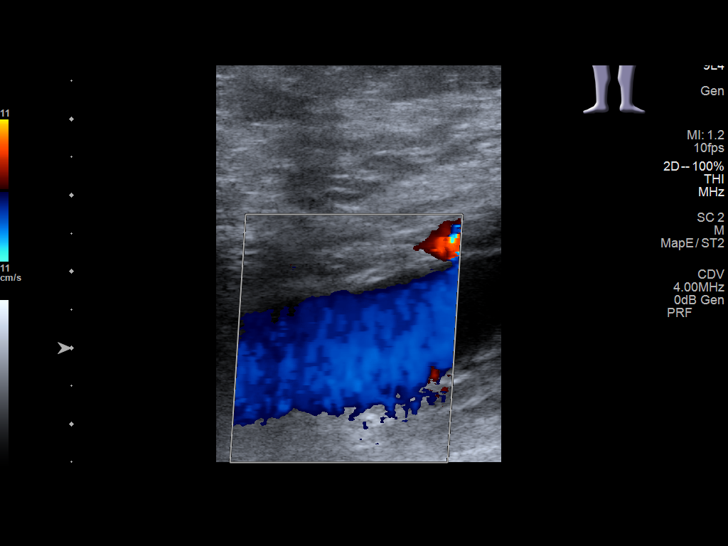
[im 33/59]
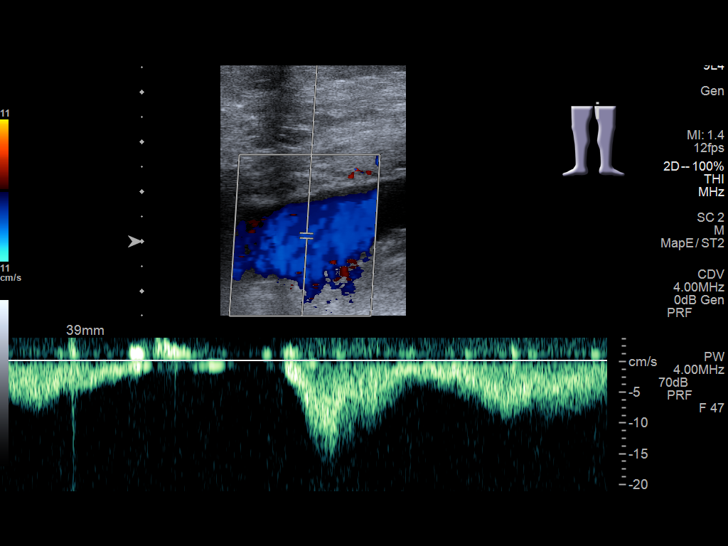
[im 38/59]
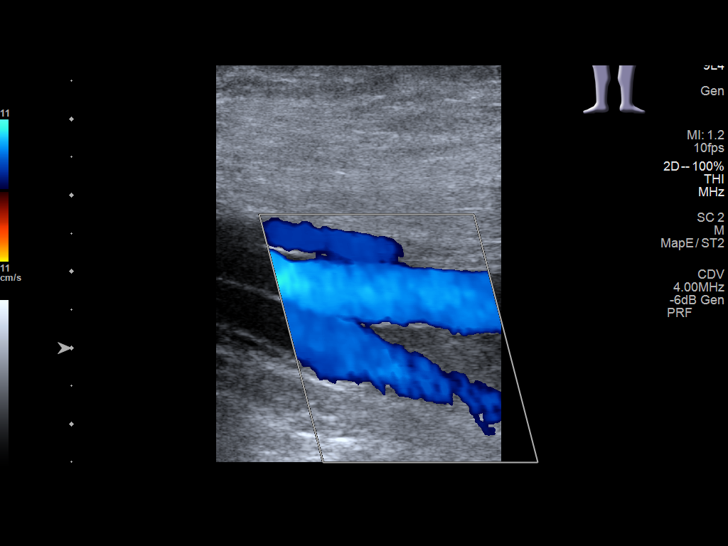
[im 43/59]
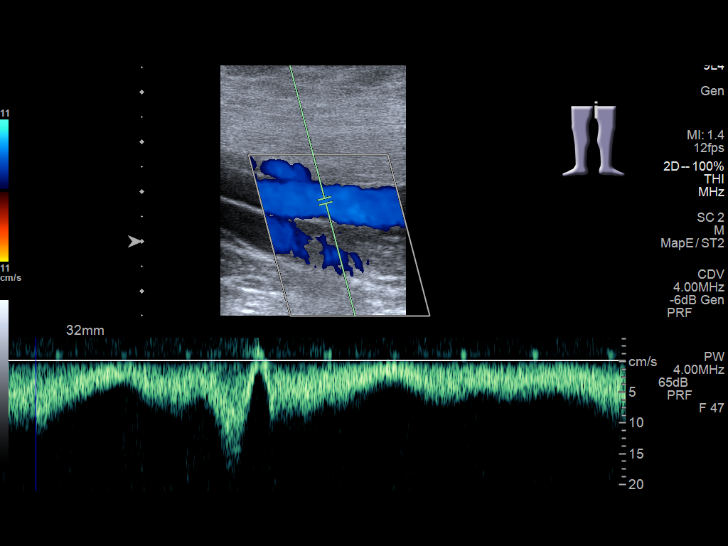
[im 48/59]
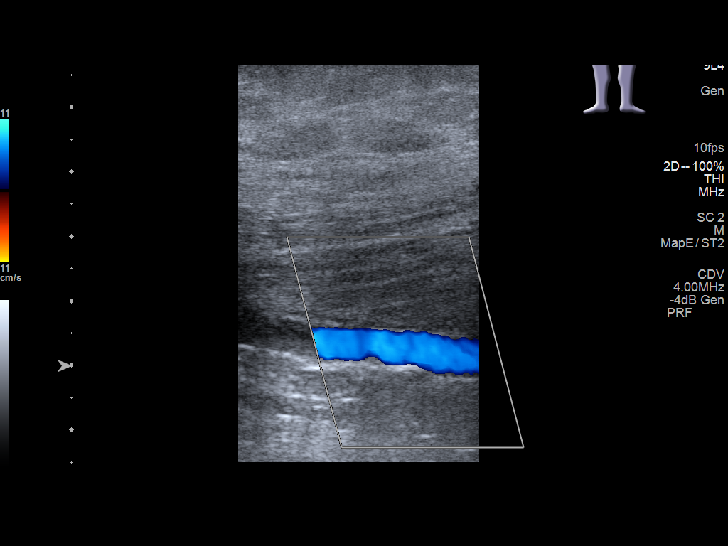
[im 53/59]
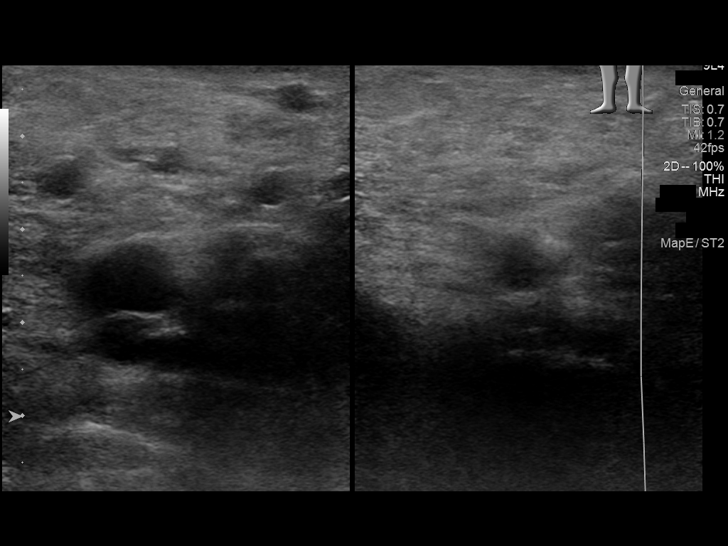
[im 59/59]
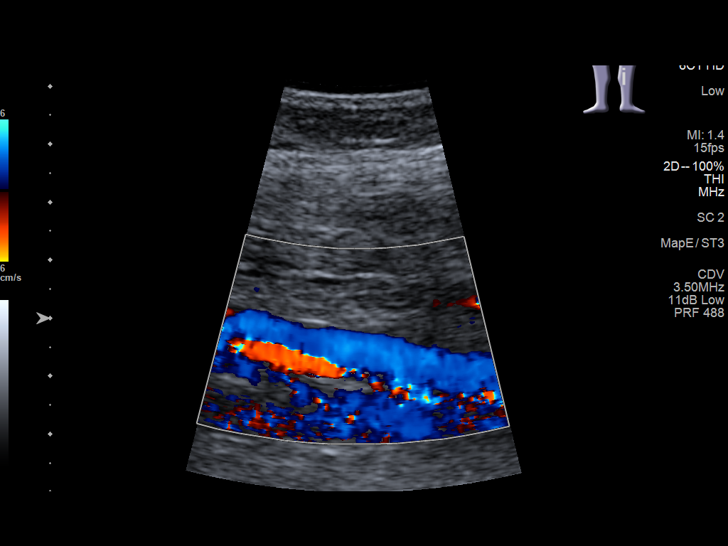

[13 of 24 positions shown; findings below may reference images not displayed]

FINDINGS: RIGHT LOWER EXTREMITY

Common Femoral Vein: No evidence of thrombus. Normal
compressibility, respiratory phasicity and response to augmentation.

Saphenofemoral Junction: No evidence of thrombus. Normal
compressibility and flow on color Doppler imaging.

Profunda Femoral Vein: No evidence of thrombus. Normal
compressibility and flow on color Doppler imaging.

Femoral Vein: No evidence of thrombus. Normal compressibility,
respiratory phasicity and response to augmentation.

Popliteal Vein: No evidence of thrombus. Normal compressibility,
respiratory phasicity and response to augmentation.

Calf Veins: No evidence of thrombus. Normal compressibility and flow
on color Doppler imaging.

Superficial Great Saphenous Vein: No evidence of thrombus. Normal
compressibility.

Venous Reflux:  None.

Other Findings:  None.

LEFT LOWER EXTREMITY

Common Femoral Vein: No evidence of thrombus. Normal
compressibility, respiratory phasicity and response to augmentation.

Saphenofemoral Junction: No evidence of thrombus. Normal
compressibility and flow on color Doppler imaging.

Profunda Femoral Vein: No evidence of thrombus. Normal
compressibility and flow on color Doppler imaging.

Femoral Vein: No evidence of thrombus. Normal compressibility,
respiratory phasicity and response to augmentation.

Popliteal Vein: No evidence of thrombus. Normal compressibility,
respiratory phasicity and response to augmentation.

Calf Veins: No evidence of thrombus. Normal compressibility and flow
on color Doppler imaging.

Superficial Great Saphenous Vein: No evidence of thrombus. Normal
compressibility.

Venous Reflux:  None.

Other Findings:  None.
IMPRESSION: No evidence of DVT within either lower extremity.

## 2020-10-03 ENCOUNTER — Ambulatory Visit: Payer: Self-pay | Admitting: Nurse Practitioner

## 2020-10-03 ENCOUNTER — Encounter: Payer: Self-pay | Admitting: Nurse Practitioner

## 2020-10-03 ENCOUNTER — Other Ambulatory Visit: Payer: Self-pay

## 2020-10-03 VITALS — BP 117/85 | HR 100 | Temp 98.7°F | Resp 14 | Ht 62.5 in | Wt 189.0 lb

## 2020-10-03 DIAGNOSIS — B3731 Acute candidiasis of vulva and vagina: Secondary | ICD-10-CM

## 2020-10-03 DIAGNOSIS — E119 Type 2 diabetes mellitus without complications: Secondary | ICD-10-CM | POA: Insufficient documentation

## 2020-10-03 MED ORDER — FLUCONAZOLE 150 MG PO TABS: 150.0000 mg | ORAL_TABLET | Freq: Once | ORAL | 1 refills | Status: AC

## 2020-10-03 MED ORDER — MONISTAT 7 COMBO PACK APP 100 & 2 MG-% (9GM) VA KIT: 1.0000 | PACK | Freq: Every evening | VAGINAL | 0 refills | Status: DC | PRN

## 2020-10-03 NOTE — Progress Notes (Signed)
   Subjective:    Patient ID: Ellen Butler, female    DOB: [birth year], 59 y.o.   MRN: [id number]  HPI Ellen Butler is a 59 y.o. female who presents to the [facility] with c/o vaginal itching. She reports the symptoms started 3 days ago. She denies vaginal d/c. She is not sexually active. She is diabetic. She denies UTI symptoms. She has had yeast infections in the past and this feels similar. She has not used any OTC medications.   Past Medical History:  Diagnosis Date  . Anxiety   . Asthma   . Cellulitis of left arm 05/2016  . COPD (chronic obstructive pulmonary disease) ([place])   . Dependent edema   . Diabetes mellitus without complication ([place])   . Insomnia   . Onychomycosis   . Over weight     Past Surgical History:  Procedure Laterality Date  . FEMUR FRACTURE SURGERY    . REPLACEMENT TOTAL KNEE     Current Outpatient Medications on File Prior to Visit  Medication Sig Dispense Refill  . albuterol (VENTOLIN HFA) 108 (90 Base) MCG/ACT inhaler Inhale into the lungs.    . ALPRAZolam (XANAX) 0.25 MG tablet Take 0.25 mg by mouth daily as needed.    . blood glucose meter kit and supplies KIT Dispense based on patient and insurance preference. Use up to four times daily as directed. (FOR ICD-9 250.00, 250.01). 1 each 0  . Buprenorphine HCl-Naloxone HCl 5.7-1.4 MG SUBL Place under the tongue.    Marland Kitchen glucose blood test strip     . metFORMIN (GLUCOPHAGE-XR) 500 MG 24 hr tablet Take 2 tablets (1,000 mg total) by mouth daily with breakfast. 60 tablet 2  . naproxen (NAPROSYN) 500 MG tablet Take 1 tablet (500 mg total) by mouth 2 (two) times daily with a meal. 20 tablet 0   No current facility-administered medications on file prior to visit.    Review of Systems  Genitourinary:       Vaginal itching  All other systems reviewed and are negative.      Objective:BP 117/85   Pulse 100   Temp 98.7 F (37.1 C)   Resp 14   Ht 5' 2.5" (1.588 m)   Wt 189 lb (85.7 kg)   SpO2 97%   BMI  34.02 kg/m     Physical Exam Constitutional:      Appearance: Normal appearance.  HENT:     Head: Normocephalic.  Eyes:     Extraocular Movements: Extraocular movements intact.  Cardiovascular:     Rate and Rhythm: Normal rate.  Pulmonary:     Effort: Pulmonary effort is normal.  Genitourinary:    Comments: Vaginal itching and irritation per patient. Vaginal exam not done.  Musculoskeletal:        General: Normal range of motion.     Cervical back: Neck supple.  Skin:    General: Skin is warm and dry.  Neurological:     Mental Status: She is alert.  Psychiatric:        Mood and Affect: Mood normal.       Assessment & Plan:  59 y.o. female with hx of diabetes here with vaginal itching x 3 days. She is not sexually active and urine today shows glucose but no infection. Will treat for monilia vaginosis with diflucan and monistat. She will RTC as needed.

## 2020-10-25 ENCOUNTER — Other Ambulatory Visit: Payer: Self-pay

## 2020-10-25 DIAGNOSIS — Z1152 Encounter for screening for COVID-19: Secondary | ICD-10-CM

## 2020-10-27 LAB — NOVEL CORONAVIRUS, NAA: SARS-CoV-2, NAA: DETECTED — AB

## 2020-10-27 LAB — SARS-COV-2, NAA 2 DAY TAT

## 2020-10-27 NOTE — Progress Notes (Signed)
Ellen Butler returned phone call.  Informed her of  + covid test results.  5 day isolation from date S/Sx started - today is her 5th day of isolation.  Wear mask for next 5 days.  Still has runny nose, cough & congestion. Advised to use OTC Cold/Flu medication to tx S/Sx. Can also contact [email or web address]  AMD

## 2020-10-27 NOTE — Progress Notes (Signed)
Tried to call Kim with + Covid test results. Went straight to voice mail. Left call back message.  AMD

## 2020-11-01 ENCOUNTER — Ambulatory Visit: Payer: Self-pay | Admitting: Adult Medicine

## 2020-11-01 ENCOUNTER — Encounter: Payer: Self-pay | Admitting: Adult Medicine

## 2020-11-01 VITALS — BP 134/71 | HR 99 | Temp 98.7°F | Ht 62.0 in | Wt 190.0 lb

## 2020-11-01 DIAGNOSIS — U071 COVID-19: Secondary | ICD-10-CM

## 2020-11-01 DIAGNOSIS — J218 Acute bronchiolitis due to other specified organisms: Secondary | ICD-10-CM

## 2020-11-01 NOTE — Progress Notes (Signed)
Subjective:    Patient ID: Ellen Butler, female    DOB: 09/13/1961, 60 y.o.   MRN: [id number]  HPI 35yr1/14/22 +covid was told she met quarrintine period despite persistent deep cough, afebrile, clear sputum, headache Reports cough is present not as deep or interrupting sleep as previous. 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 Due to Copd hx will give Allupent inhaler Supportive care instructions request per patient Vitc 1gm  Vitd 2000 bid Zinc 550mqd Will need follow up for smoke cessation after recovery

## 2021-01-18 ENCOUNTER — Other Ambulatory Visit: Payer: Self-pay

## 2021-01-18 ENCOUNTER — Ambulatory Visit: Payer: Self-pay

## 2021-01-18 DIAGNOSIS — Z0000 Encounter for general adult medical examination without abnormal findings: Secondary | ICD-10-CM

## 2021-01-18 LAB — POCT URINALYSIS DIPSTICK
Bilirubin, UA: NEGATIVE
Blood, UA: NEGATIVE
Glucose, UA: NEGATIVE
Ketones, UA: NEGATIVE
Leukocytes, UA: NEGATIVE
Nitrite, UA: NEGATIVE
Protein, UA: POSITIVE — AB
Spec Grav, UA: 1.015 (ref 1.010–1.025)
Urobilinogen, UA: 0.2 E.U./dL
pH, UA: 7 (ref 5.0–8.0)

## 2021-01-18 NOTE — Progress Notes (Signed)
Scheduled to complete physical 01/25/21 with Bridget Hartshorn, Pa-C.  AMD

## 2021-01-21 LAB — CMP12+LP+TP+TSH+6AC+CBC/D/PLT
ALT: 26 IU/L (ref 0–32)
AST: 20 IU/L (ref 0–40)
Albumin/Globulin Ratio: 1.6 (ref 1.2–2.2)
Albumin: 4.3 g/dL (ref 3.8–4.9)
Alkaline Phosphatase: 117 IU/L (ref 44–121)
BUN/Creatinine Ratio: 20 (ref 9–23)
BUN: 14 mg/dL (ref 6–24)
Basophils Absolute: 0.1 10*3/uL (ref 0.0–0.2)
Basos: 1 %
Bilirubin Total: 0.3 mg/dL (ref 0.0–1.2)
Calcium: 9.3 mg/dL (ref 8.7–10.2)
Chloride: 98 mmol/L (ref 96–106)
Chol/HDL Ratio: 4.7 ratio — ABNORMAL HIGH (ref 0.0–4.4)
Cholesterol, Total: 197 mg/dL (ref 100–199)
Creatinine, Ser: 0.7 mg/dL (ref 0.57–1.00)
EOS (ABSOLUTE): 0.2 10*3/uL (ref 0.0–0.4)
Eos: 2 %
Estimated CHD Risk: 1.2 times avg. — ABNORMAL HIGH (ref 0.0–1.0)
Free Thyroxine Index: 2.4 (ref 1.2–4.9)
GGT: 16 IU/L (ref 0–60)
Globulin, Total: 2.7 g/dL (ref 1.5–4.5)
Glucose: 238 mg/dL — ABNORMAL HIGH (ref 65–99)
HDL: 42 mg/dL (ref >39)
Hematocrit: 43.9 % (ref 34.0–46.6)
Hemoglobin: 15.3 g/dL (ref 11.1–15.9)
Immature Grans (Abs): 0 10*3/uL (ref 0.0–0.1)
Immature Granulocytes: 0 %
Iron: 103 ug/dL (ref 27–159)
LDH: 202 IU/L (ref 119–226)
LDL Chol Calc (NIH): 105 mg/dL — ABNORMAL HIGH (ref 0–99)
Lymphocytes Absolute: 2.5 10*3/uL (ref 0.7–3.1)
Lymphs: 29 %
MCH: 31.5 pg (ref 26.6–33.0)
MCHC: 34.9 g/dL (ref 31.5–35.7)
MCV: 90 fL (ref 79–97)
Monocytes Absolute: 0.6 10*3/uL (ref 0.1–0.9)
Monocytes: 7 %
Neutrophils Absolute: 5.4 10*3/uL (ref 1.4–7.0)
Neutrophils: 61 %
Phosphorus: 3.3 mg/dL (ref 3.0–4.3)
Platelets: 252 10*3/uL (ref 150–450)
Potassium: 4.5 mmol/L (ref 3.5–5.2)
RBC: 4.86 x10E6/uL (ref 3.77–5.28)
RDW: 12.2 % (ref 11.7–15.4)
Sodium: 136 mmol/L (ref 134–144)
T3 Uptake Ratio: 31 % (ref 24–39)
T4, Total: 7.8 ug/dL (ref 4.5–12.0)
TSH: 2.76 u[IU]/mL (ref 0.450–4.500)
Total Protein: 7 g/dL (ref 6.0–8.5)
Triglycerides: 295 mg/dL — ABNORMAL HIGH (ref 0–149)
Uric Acid: 3.4 mg/dL (ref 3.0–7.2)
VLDL Cholesterol Cal: 50 mg/dL — ABNORMAL HIGH (ref 5–40)
WBC: 8.8 10*3/uL (ref 3.4–10.8)
eGFR: 101 mL/min/{1.73_m2} (ref >59)

## 2021-01-21 LAB — MICROALBUMIN / CREATININE URINE RATIO
Creatinine, Urine: 133.2 mg/dL
Microalb/Creat Ratio: 8 mg/g creat (ref 0–29)
Microalbumin, Urine: 11 ug/mL

## 2021-01-21 LAB — HGB A1C W/O EAG: Hgb A1c MFr Bld: 11.2 % — ABNORMAL HIGH (ref 4.8–5.6)

## 2021-01-24 ENCOUNTER — Other Ambulatory Visit: Payer: Self-pay

## 2021-01-24 ENCOUNTER — Ambulatory Visit: Payer: Self-pay | Admitting: Physician Assistant

## 2021-01-24 ENCOUNTER — Encounter: Payer: Self-pay | Admitting: Physician Assistant

## 2021-01-24 VITALS — BP 133/68 | HR 97 | Temp 98.1°F | Resp 12 | Ht 62.0 in | Wt 184.0 lb

## 2021-01-24 DIAGNOSIS — Z0000 Encounter for general adult medical examination without abnormal findings: Secondary | ICD-10-CM

## 2021-01-24 MED ORDER — ROSUVASTATIN CALCIUM 40 MG PO TABS: 40.0000 mg | ORAL_TABLET | Freq: Every day | ORAL | 3 refills | Status: DC

## 2021-01-24 MED ORDER — ALBUTEROL SULFATE HFA 108 (90 BASE) MCG/ACT IN AERS: 2.0000 | INHALATION_SPRAY | Freq: Four times a day (QID) | RESPIRATORY_TRACT | 1 refills | Status: DC | PRN

## 2021-01-24 MED ORDER — METFORMIN HCL 1000 MG PO TABS: 1000.0000 mg | ORAL_TABLET | Freq: Two times a day (BID) | ORAL | 3 refills | Status: DC

## 2021-01-24 NOTE — Progress Notes (Signed)
   Subjective: Annual physical exam    Patient ID: Ellen Butler, female    DOB: [birth year], 60 y.o.   MRN: [id number]  HPI Patient presents annual physical exam.  Patient is aware that her blood glucose and triglycerides are elevated.  Patient states noncompliance with but has improved in the past month.   Review of Systems    Anxiety, diabetes, and hyperlipidemia. Objective:   Physical Exam  No acute distress.  BP is 133/68, pulse 97, respiration 12, temperature 98.1, O2 sat is 96% on room air.  HEENT is unremarkable.  Neck is supple for adenopathy or bruits.  Lungs with mild wheezing.  Heart is regular rate and rhythm.  No acute findings on EKG.  Abdomen  with negative HSM, normoactive bowel sounds, soft, nontender to palpation.  No obvious deformity to the upper or lower extremities.  Patient has full and equal range of motion of the upper and lower extremities.  No obvious deformity to the cervical lumbar spine.  Patient has full and equal range of motion of the cervical lumbar spine.  Cranial nerves II through XII grossly intact.      Assessment & Plan: Well exam.  Diabetes.  Hyperlipidemia.  Discussed lab results with patient.  Hemoglobin A1c is 11.2.  Cholesterol is 197 which is increased from 163 from previous exam.  Triglycerides of 295 which is is an increase from 164 from previous exam.  HDL is 42 which is increased from 37.  VLDL is 50 which increased from 29 from last exam.  LDL is 105 which increased from 97.  Cholesterol/HDL ratio is 4.7 which is increased from 4.5 from last exam.  Patient is amenable to compliance with Metformin at 1000 mg twice daily.  Patient also will start Crestor 40 mg.  Patient to follow-up in 3 months with fasting labs.

## 2021-01-24 NOTE — Progress Notes (Signed)
Requesting Rx refill for Albuterol Inhaler.

## 2021-01-29 ENCOUNTER — Telehealth: Payer: Self-pay

## 2021-01-29 ENCOUNTER — Other Ambulatory Visit: Payer: Self-pay | Admitting: Physician Assistant

## 2021-01-29 MED ORDER — METFORMIN HCL ER (MOD) 1000 MG PO TB24: 1000.0000 mg | ORAL_TABLET | Freq: Two times a day (BID) | ORAL | 2 refills | Status: DC

## 2021-01-29 NOTE — Telephone Encounter (Signed)
Ellen Butler called to request Metformin 1000 mg tab 2x daily be changed to ER version. She is requesting script be written for 1 month with 2 additional refills.

## 2021-02-13 IMAGING — US US EXTREM UP*L* LTD
1 series · 14 of 25 positions shown · non-contrast
Comparison: None.

CLINICAL DATA: Animal bite to the left forearm 2 weeks ago.

EXAM:
ULTRASOUND LEFT UPPER EXTREMITY LIMITED
TECHNIQUE: Ultrasound examination of the upper extremity soft tissues was
performed in the area of clinical concern.

[Series 1: us extrem up*left* ltd · 0.06mm/px · 31 acquisitions, 14 frames shown]
[im 1/31]
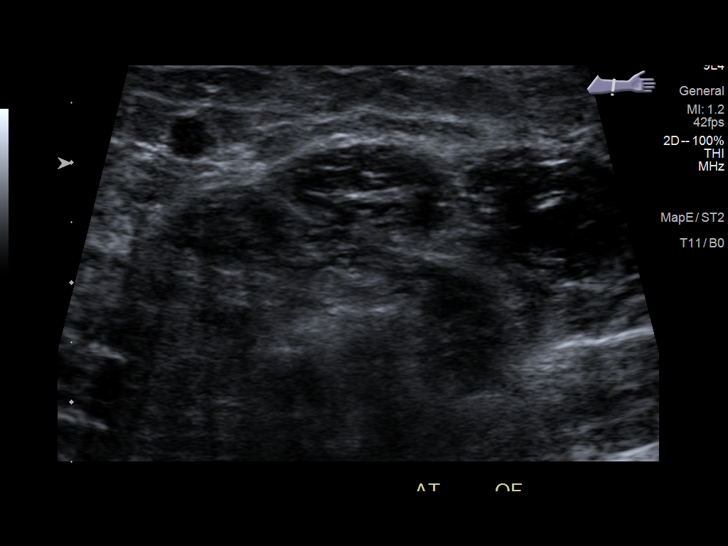
[im 3/31]
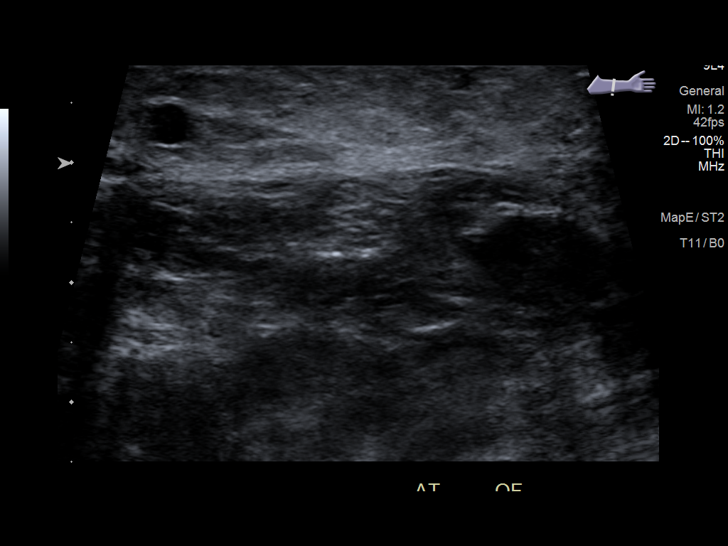
[im 6/31]
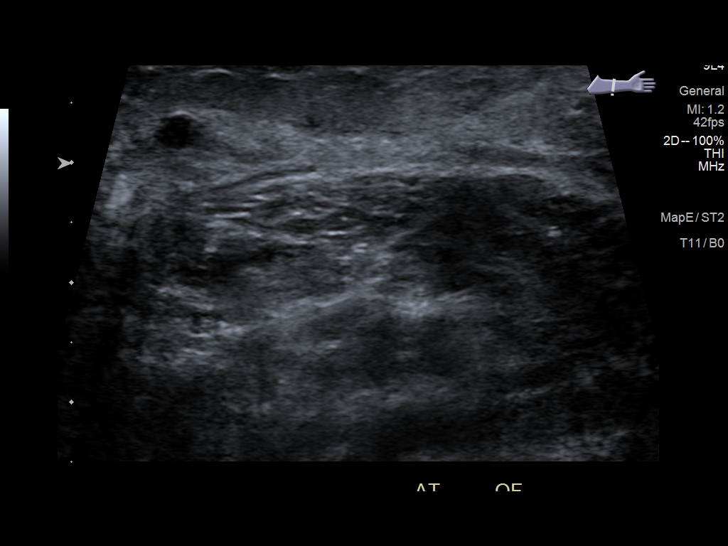
[im 8/31]
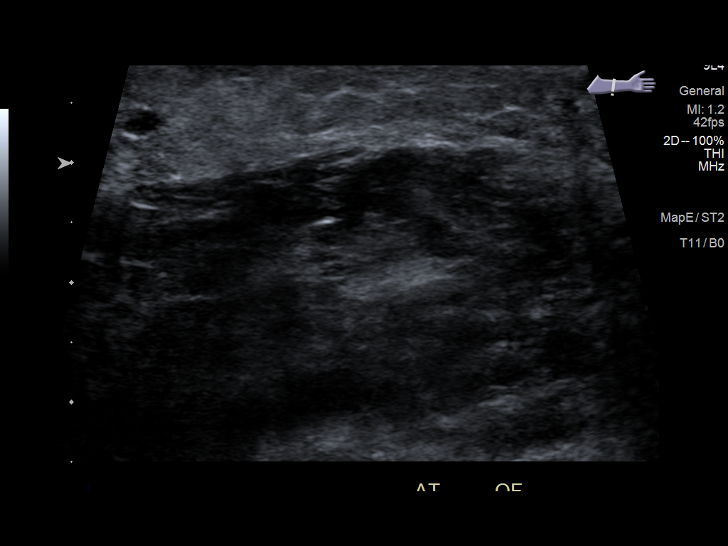
[im 11/31]
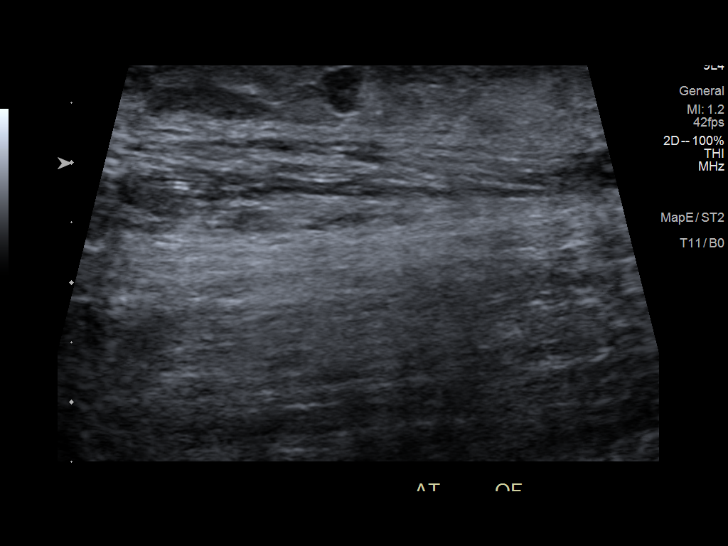
[im 12/31]
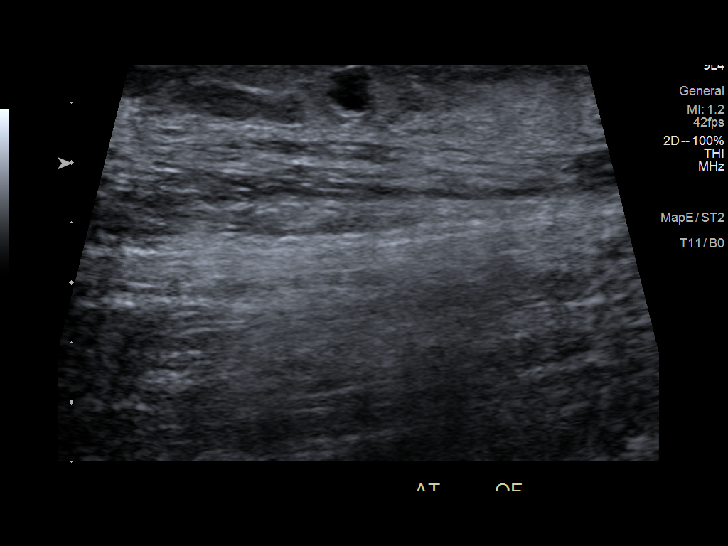
[im 14/31]
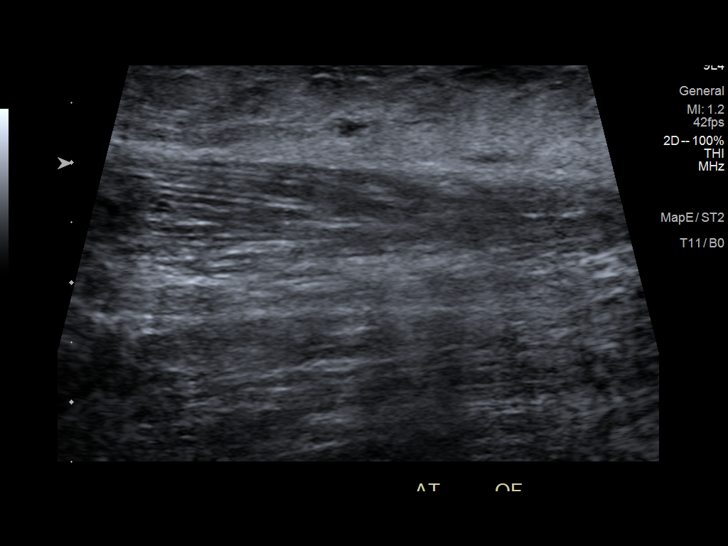
[im 17/31]
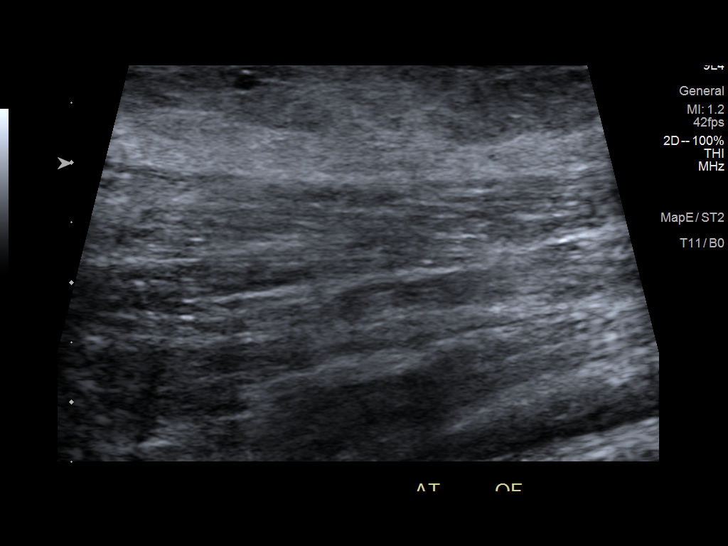
[im 19/31]
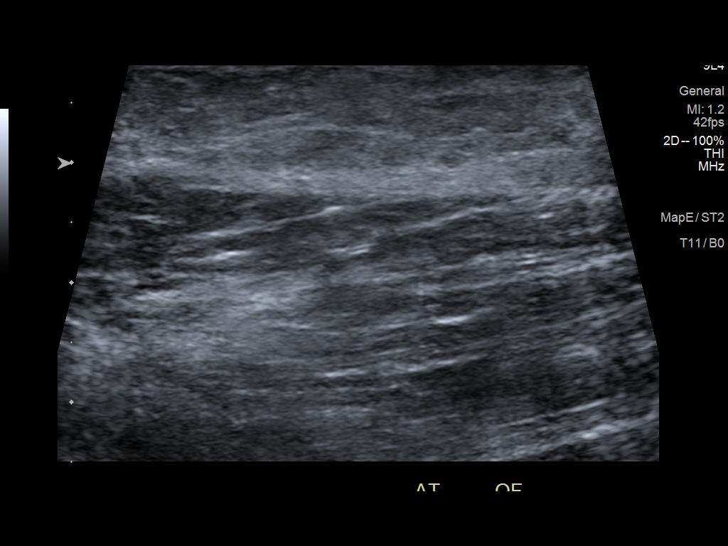
[im 21/31]
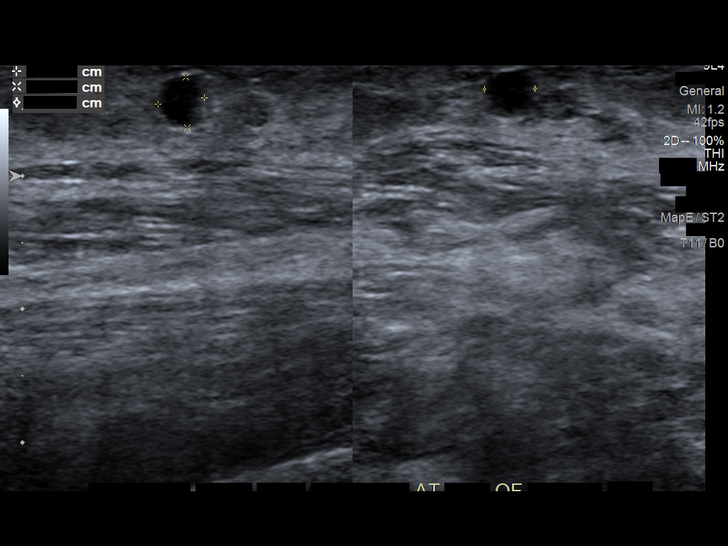
[im 23/31]
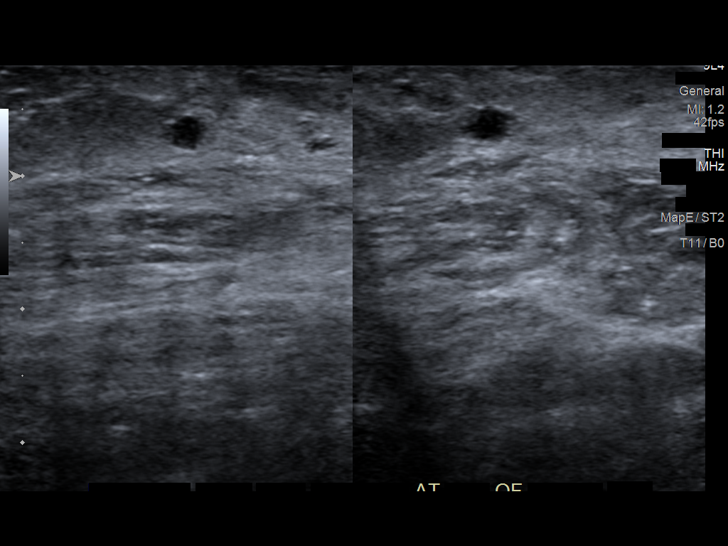
[im 26/31]
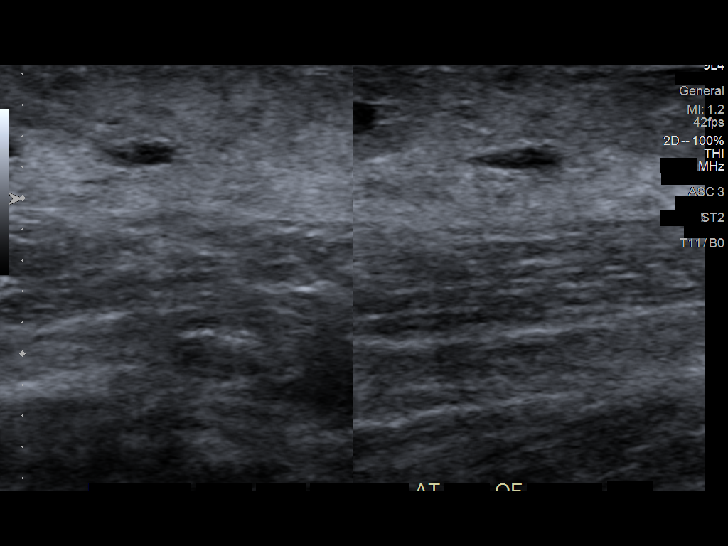
[im 28/31]
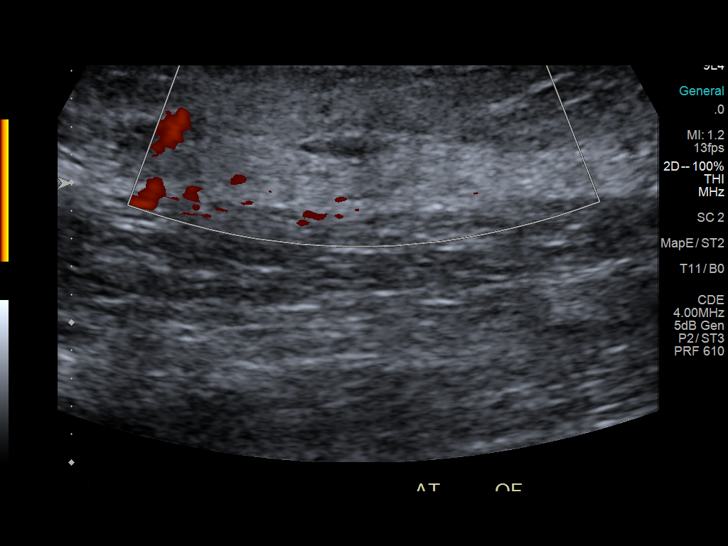
[im 31/31]
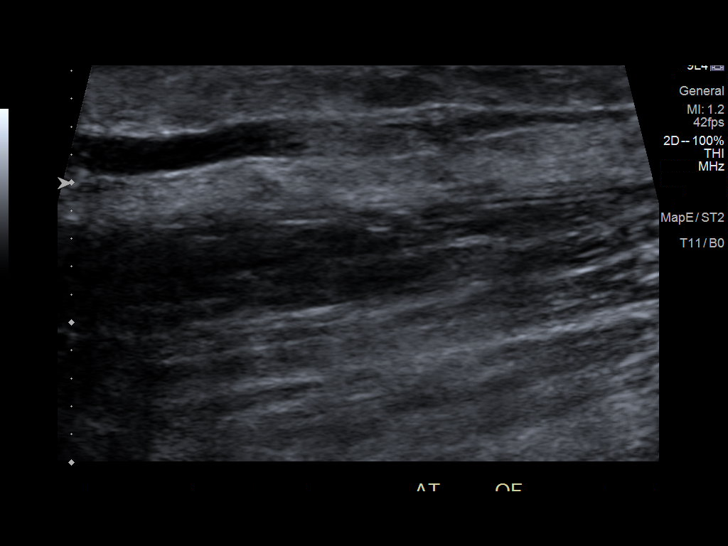

[14 of 25 positions shown; findings below may reference images not displayed]

FINDINGS: There are three tiny round hypoechoic and anechoic fluid collections
in the superficial left forearm at the site of the animal bite. The
largest of these measures 4 x 2 x 6 mm. There is no internal or
surrounding vascularity. There is no large fluid collection or soft
tissue mass.
IMPRESSION: 1. Three tiny fluid collections in the superficial left forearm at
the site of the animal bite, possibly small abscesses. No large
fluid collection.

These results will be called to the ordering clinician or
representative by the [HOSPITAL] at the imaging location.

## 2021-04-23 NOTE — Progress Notes (Deleted)
Pt scheduled to complete physical 04/25/21

## 2021-04-25 ENCOUNTER — Other Ambulatory Visit: Payer: 59

## 2021-05-04 ENCOUNTER — Ambulatory Visit: Payer: Self-pay

## 2021-05-04 ENCOUNTER — Other Ambulatory Visit: Payer: Self-pay

## 2021-05-04 DIAGNOSIS — E1165 Type 2 diabetes mellitus with hyperglycemia: Secondary | ICD-10-CM

## 2021-05-04 DIAGNOSIS — Z09 Encounter for follow-up examination after completed treatment for conditions other than malignant neoplasm: Secondary | ICD-10-CM

## 2021-05-04 NOTE — Progress Notes (Signed)
Pt presents today for 3 month fasting labs follow up per Laray Anger  CL,RMA

## 2021-05-05 LAB — LIPID PANEL
Chol/HDL Ratio: 4.1 ratio (ref 0.0–4.4)
Cholesterol, Total: 168 mg/dL (ref 100–199)
HDL: 41 mg/dL (ref >39)
LDL Chol Calc (NIH): 92 mg/dL (ref 0–99)
Triglycerides: 208 mg/dL — ABNORMAL HIGH (ref 0–149)
VLDL Cholesterol Cal: 35 mg/dL (ref 5–40)

## 2021-05-05 LAB — HGB A1C W/O EAG: Hgb A1c MFr Bld: 7.8 % — ABNORMAL HIGH (ref 4.8–5.6)

## 2021-05-28 ENCOUNTER — Other Ambulatory Visit: Payer: Self-pay

## 2021-05-28 MED ORDER — METFORMIN HCL ER (MOD) 1000 MG PO TB24: 1000.0000 mg | ORAL_TABLET | Freq: Two times a day (BID) | ORAL | 2 refills | Status: DC

## 2021-06-06 ENCOUNTER — Other Ambulatory Visit: Payer: Self-pay

## 2021-06-06 DIAGNOSIS — J449 Chronic obstructive pulmonary disease, unspecified: Secondary | ICD-10-CM

## 2021-06-06 MED ORDER — ALBUTEROL SULFATE HFA 108 (90 BASE) MCG/ACT IN AERS: 2.0000 | INHALATION_SPRAY | Freq: Four times a day (QID) | RESPIRATORY_TRACT | 5 refills | Status: DC | PRN

## 2021-08-07 ENCOUNTER — Other Ambulatory Visit: Payer: Self-pay

## 2021-08-07 DIAGNOSIS — E7800 Pure hypercholesterolemia, unspecified: Secondary | ICD-10-CM

## 2021-08-07 DIAGNOSIS — E1165 Type 2 diabetes mellitus with hyperglycemia: Secondary | ICD-10-CM

## 2021-08-07 NOTE — Progress Notes (Signed)
Pt presents today for 3 month lipid and a1c. Ellen Butler

## 2021-08-08 LAB — LIPID PANEL
Chol/HDL Ratio: 3.5 ratio (ref 0.0–4.4)
Cholesterol, Total: 180 mg/dL (ref 100–199)
HDL: 51 mg/dL (ref >39)
LDL Chol Calc (NIH): 106 mg/dL — ABNORMAL HIGH (ref 0–99)
Triglycerides: 129 mg/dL (ref 0–149)
VLDL Cholesterol Cal: 23 mg/dL (ref 5–40)

## 2021-08-08 LAB — HGB A1C W/O EAG: Hgb A1c MFr Bld: 7.1 % — ABNORMAL HIGH (ref 4.8–5.6)

## 2021-08-09 ENCOUNTER — Other Ambulatory Visit: Payer: Self-pay

## 2021-08-09 ENCOUNTER — Other Ambulatory Visit: Payer: Self-pay | Admitting: Physician Assistant

## 2021-08-09 ENCOUNTER — Ambulatory Visit: Payer: Self-pay | Admitting: Physician Assistant

## 2021-08-09 ENCOUNTER — Encounter: Payer: Self-pay | Admitting: Physician Assistant

## 2021-08-09 VITALS — BP 126/82 | HR 108 | Temp 97.5°F | Resp 16 | Ht 62.0 in | Wt 174.0 lb

## 2021-08-09 DIAGNOSIS — E1165 Type 2 diabetes mellitus with hyperglycemia: Secondary | ICD-10-CM

## 2021-08-09 MED ORDER — METFORMIN HCL ER 500 MG PO TB24: 1000.0000 mg | ORAL_TABLET | Freq: Two times a day (BID) | ORAL | 3 refills | Status: DC

## 2021-08-09 MED ORDER — ACCU-CHEK MULTICLIX LANCETS MISC: 12 refills | Status: AC

## 2021-08-09 MED ORDER — ACCU-CHEK AVIVA PLUS W/DEVICE KIT: 1.0000 | PACK | Freq: Three times a day (TID) | 0 refills | Status: DC

## 2021-08-09 MED ORDER — GLUCOSE BLOOD VI STRP: ORAL_STRIP | 12 refills | Status: AC

## 2021-08-09 NOTE — Progress Notes (Signed)
   Subjective: Diabetes    Patient ID: Ellen Butler, female    DOB: 01/23/1961, 60 y.o.   MRN: [id number]  HPI Patient here for follow-up of diabetes.  Patient states he has been a 10 pound weight loss since her last exam.  She attributes her weight loss secondary to diet and increased physical activities.Patient is currently taking metformin 1000 mg twice daily.  Patient also admits to noncompliance of Crestor for hyperlipidemia.   Review of Systems Diabetes and hyperlipidemia.    Objective:   Physical Exam  No acute distress.  Temperature 97.5, pulse 108, respiration 16, BP is 126/82, patient 90% O2 sat on room air.  Patient will consider 4 pounds and BMI is 31.83. HEENT is unremarkable.  Neck is supple without lymphadenopathy or bruits.  Lungs are clear to auscultation.  Heart is regular rate and rhythm at 96 bpm. Patient hemoglobin A1c has decreased from 7.8-7.1.  Patient cholesterol has increased from 168 to 180, triglycerides has decreased from 208 to 129, HDL has increased from 41 to 51.     Assessment & Plan: Diabetes   Patient was congratulated on progress towards control of her diabetes.  We will follow-up for hemoglobin A1c in 3 months.  Patient has been noncompliant with statin for hyperlipidemia and we agreed to hold off on the medication and follow-up in 6 months.

## 2021-08-09 NOTE — Progress Notes (Signed)
Completed physical 01/24/2021 DM Follow-up & review lab results  States never took the med for TG's  Fasting Fingerstick BS = 141

## 2021-08-21 ENCOUNTER — Other Ambulatory Visit: Payer: Self-pay

## 2021-08-21 ENCOUNTER — Encounter: Payer: Self-pay | Admitting: Physician Assistant

## 2021-08-21 ENCOUNTER — Ambulatory Visit: Payer: Self-pay | Admitting: Physician Assistant

## 2021-08-21 ENCOUNTER — Ambulatory Visit: Payer: 59 | Admitting: Physician Assistant

## 2021-08-21 VITALS — BP 120/70 | HR 105 | Temp 98.2°F | Resp 16 | Wt 175.0 lb

## 2021-08-21 DIAGNOSIS — R609 Edema, unspecified: Secondary | ICD-10-CM

## 2021-08-21 DIAGNOSIS — I872 Venous insufficiency (chronic) (peripheral): Secondary | ICD-10-CM

## 2021-08-21 NOTE — Progress Notes (Signed)
   Subjective: Left ankle pain and edema    Patient ID: Ellen Butler, female    DOB: [birth year], 60 y.o.   MRN: [id number]  HPI Patient complaining of left ankle pain edema which increased with walking.  Rates pain as a 7/10 with walking.  Patient states constant pain a 5/10 at rest.  Denies dyspnea or chest pain.  States she has no increase venous distention of the bilateral lower extremities.   Review of Systems Chronic pain syndrome, COPD, diabetes, and hyperlipidemia.    Objective:   Physical Exam No acute distress.  Temperature 98.2, pulse 105, respiration 16, BP is 120/70, patient 96% O2 sat on room air.  Patient was hung 75 pounds and BMI is 32.01. No acute distress.  Lungs clear to auscultation.  Heart regular rate and rhythm. Lower extremities showed bilateral spider veins and mild peripheral edema left greater than right of the lower extremities.       Assessment & Plan: Peripheral edema /venous distention   Patient is amenable to a trial of compression stockings and follow-up in 1 week.  Advised to wear the stockings while working.  Do not sleep with compression stockings.

## 2021-08-21 NOTE — Progress Notes (Signed)
Past couple of days left ankle has been hurting No known injury but thinks she could have twisted it. Didn't notice the swelling until today. Sore from ankle up the front side of the lower leg Has a vein that she says "popped out" over the weekend.  No OTC meds No ice or heat No elevation No compression  Hurts all the time Rates pain 7-8 with walking & 5 at rest  Limited range of motion   AMD

## 2021-09-03 ENCOUNTER — Other Ambulatory Visit: Payer: Self-pay | Admitting: Physician Assistant

## 2021-09-03 MED ORDER — FLUCONAZOLE 150 MG PO TABS: 150.0000 mg | ORAL_TABLET | Freq: Once | ORAL | 0 refills | Status: AC

## 2021-10-10 ENCOUNTER — Encounter: Payer: Self-pay | Admitting: Physician Assistant

## 2021-10-10 ENCOUNTER — Other Ambulatory Visit: Payer: Self-pay

## 2021-10-10 ENCOUNTER — Ambulatory Visit: Payer: 59 | Admitting: Physician Assistant

## 2021-10-10 DIAGNOSIS — Z1152 Encounter for screening for COVID-19: Secondary | ICD-10-CM

## 2021-10-10 DIAGNOSIS — R052 Subacute cough: Secondary | ICD-10-CM

## 2021-10-10 LAB — POC COVID19 BINAXNOW: SARS Coronavirus 2 Ag: NEGATIVE

## 2021-10-10 LAB — POCT INFLUENZA A/B
Influenza A, POC: NEGATIVE
Influenza B, POC: NEGATIVE

## 2021-10-10 MED ORDER — PSEUDOEPH-BROMPHEN-DM 30-2-10 MG/5ML PO SYRP: 5.0000 mL | ORAL_SOLUTION | Freq: Four times a day (QID) | ORAL | 0 refills | Status: DC | PRN

## 2021-10-10 NOTE — Progress Notes (Signed)
Pt has had dry cough since 10/09/21.  Rapid COVID: NEGATIVE FLU: A & B NEGATIVE  PCR COVID: PENDING

## 2021-10-10 NOTE — Progress Notes (Signed)
Pt presents today with two days of dry coughing and stuff nose with 100.5 temp.

## 2021-10-10 NOTE — Progress Notes (Signed)
° °  Subjective: Cough    Patient ID: Ellen Butler, female    DOB: 03-12-1961, 60 y.o.   MRN: [id number]  HPI Patient presents with 2 days of nonproductive cough.  Patient tested negative for COVID-19 today.  PCR pending.   Review of Systems Negative    Objective:   Physical Exam  Virtual visit      Assessment & Plan: Cough

## 2021-10-11 LAB — NOVEL CORONAVIRUS, NAA: SARS-CoV-2, NAA: NOT DETECTED

## 2021-10-11 LAB — SARS-COV-2, NAA 2 DAY TAT

## 2021-11-09 ENCOUNTER — Other Ambulatory Visit: Payer: 59

## 2022-01-16 ENCOUNTER — Encounter: Payer: Self-pay | Admitting: Physician Assistant

## 2022-01-16 ENCOUNTER — Ambulatory Visit: Payer: Self-pay | Admitting: Physician Assistant

## 2022-01-16 VITALS — BP 124/73 | HR 86 | Temp 97.8°F | Resp 16 | Ht 62.0 in | Wt 174.0 lb

## 2022-01-16 DIAGNOSIS — F17210 Nicotine dependence, cigarettes, uncomplicated: Secondary | ICD-10-CM

## 2022-01-16 MED ORDER — NICOTINE 21 MG/24HR TD PT24: 21.0000 mg | MEDICATED_PATCH | Freq: Every day | TRANSDERMAL | 0 refills | Status: DC

## 2022-01-16 NOTE — Progress Notes (Signed)
Dental implant surgery scheduled for 01/22/2022 & here today requesting nicotine patches because she's not supposed to smoke after her surgery. ? ?States she's used patched before when in the hospital & couldn't smoke there. ? ?AMD ?

## 2022-01-16 NOTE — Progress Notes (Signed)
? ?  Subjective: Request for nicotine patch  ? ? Patient ID: Ellen Butler, female    DOB: [birth year], 61 y.o.   MRN: [id number] ? ?HPI ?Patient requests adjuvant for temporary smoking cessation due to schedule dental implants.  Advised by oral surgeon no smoking for 3 weeks prior to procedure.  Patient is requesting nicotine patch which she has used in the past for temporary smoking sensation for procedures.  Patient smokes 1-1/2 packs/day for the past 40 years.  Past medical history is remarkable for COPD, diabetes, hyperlipidemia. ? ? ?Review of Systems ?Negative except for chief complaint ?   ?Objective:  ? Physical Exam ?Temperature is 97.8, respiration 16, pulse 86, and BP is 124/73.  Patient 95% O2 sat on room air.  Patient weighs 174 pounds and BMI is 31.83. ?No acute distress.  Patient speaks with "raspy" voice. ?HEENT is unremarkable suffer devitalized teeth.  Neck is supple for lymphadenopathy or bruits.  Lungs with mild wheezing and cough with inspiration.  Heart is regular rate and rhythm. ? ? ? ?   ?Assessment & Plan: Temporary smoke cessation  ?Patient had a prescription for nicotine patch for 28 days.  Advised patient she wished to continue to follow-up with this clinic. ? ?

## 2022-01-22 NOTE — Progress Notes (Signed)
Pt presents today for physical labs, will return to clinic for scheduled physical.  

## 2022-01-23 ENCOUNTER — Ambulatory Visit: Payer: Self-pay

## 2022-01-23 DIAGNOSIS — Z0000 Encounter for general adult medical examination without abnormal findings: Secondary | ICD-10-CM

## 2022-01-23 LAB — POCT URINALYSIS DIPSTICK
Bilirubin, UA: NEGATIVE
Blood, UA: NEGATIVE
Glucose, UA: NEGATIVE
Ketones, UA: NEGATIVE
Leukocytes, UA: NEGATIVE
Nitrite, UA: NEGATIVE
Protein, UA: NEGATIVE
Spec Grav, UA: 1.01 (ref 1.010–1.025)
Urobilinogen, UA: 0.2 E.U./dL
pH, UA: 7 (ref 5.0–8.0)

## 2022-01-25 LAB — CMP12+LP+TP+TSH+6AC+CBC/D/PLT
ALT: 19 IU/L (ref 0–32)
AST: 21 IU/L (ref 0–40)
Albumin/Globulin Ratio: 2.2 (ref 1.2–2.2)
Albumin: 5 g/dL — ABNORMAL HIGH (ref 3.8–4.9)
Alkaline Phosphatase: 116 IU/L (ref 44–121)
BUN/Creatinine Ratio: 15 (ref 9–23)
BUN: 10 mg/dL (ref 6–24)
Basophils Absolute: 0 10*3/uL (ref 0.0–0.2)
Basos: 1 %
Bilirubin Total: 0.4 mg/dL (ref 0.0–1.2)
Calcium: 10 mg/dL (ref 8.7–10.2)
Chloride: 101 mmol/L (ref 96–106)
Chol/HDL Ratio: 4.6 ratio — ABNORMAL HIGH (ref 0.0–4.4)
Cholesterol, Total: 251 mg/dL — ABNORMAL HIGH (ref 100–199)
Creatinine, Ser: 0.66 mg/dL (ref 0.57–1.00)
EOS (ABSOLUTE): 0.2 10*3/uL (ref 0.0–0.4)
Eos: 2 %
Estimated CHD Risk: 1.2 times avg. — ABNORMAL HIGH (ref 0.0–1.0)
Free Thyroxine Index: 2 (ref 1.2–4.9)
GGT: 16 IU/L (ref 0–60)
Globulin, Total: 2.3 g/dL (ref 1.5–4.5)
Glucose: 138 mg/dL — ABNORMAL HIGH (ref 70–99)
HDL: 54 mg/dL (ref >39)
Hematocrit: 45.4 % (ref 34.0–46.6)
Hemoglobin: 15.9 g/dL (ref 11.1–15.9)
Immature Grans (Abs): 0 10*3/uL (ref 0.0–0.1)
Immature Granulocytes: 0 %
Iron: 108 ug/dL (ref 27–159)
LDH: 230 IU/L — ABNORMAL HIGH (ref 119–226)
LDL Chol Calc (NIH): 158 mg/dL — ABNORMAL HIGH (ref 0–99)
Lymphocytes Absolute: 2.2 10*3/uL (ref 0.7–3.1)
Lymphs: 27 %
MCH: 32.1 pg (ref 26.6–33.0)
MCHC: 35 g/dL (ref 31.5–35.7)
MCV: 92 fL (ref 79–97)
Monocytes Absolute: 0.5 10*3/uL (ref 0.1–0.9)
Monocytes: 6 %
Neutrophils Absolute: 5.1 10*3/uL (ref 1.4–7.0)
Neutrophils: 64 %
Phosphorus: 3.5 mg/dL (ref 3.0–4.3)
Platelets: 267 10*3/uL (ref 150–450)
Potassium: 4.5 mmol/L (ref 3.5–5.2)
RBC: 4.95 x10E6/uL (ref 3.77–5.28)
RDW: 12.3 % (ref 11.7–15.4)
Sodium: 145 mmol/L — ABNORMAL HIGH (ref 134–144)
T3 Uptake Ratio: 26 % (ref 24–39)
T4, Total: 7.8 ug/dL (ref 4.5–12.0)
TSH: 4.59 u[IU]/mL — ABNORMAL HIGH (ref 0.450–4.500)
Total Protein: 7.3 g/dL (ref 6.0–8.5)
Triglycerides: 215 mg/dL — ABNORMAL HIGH (ref 0–149)
Uric Acid: 3.9 mg/dL (ref 3.0–7.2)
VLDL Cholesterol Cal: 39 mg/dL (ref 5–40)
WBC: 8.1 10*3/uL (ref 3.4–10.8)
eGFR: 102 mL/min/{1.73_m2} (ref >59)

## 2022-01-25 LAB — MICROALBUMIN / CREATININE URINE RATIO
Creatinine, Urine: 33.6 mg/dL
Microalb/Creat Ratio: <9 mg/g creat (ref 0–29)
Microalbumin, Urine: <3 ug/mL

## 2022-01-25 LAB — HGB A1C W/O EAG: Hgb A1c MFr Bld: 6.8 % — ABNORMAL HIGH (ref 4.8–5.6)

## 2022-01-30 ENCOUNTER — Encounter: Payer: 59 | Admitting: Physician Assistant

## 2022-01-31 ENCOUNTER — Ambulatory Visit: Payer: Self-pay | Admitting: Physician Assistant

## 2022-01-31 ENCOUNTER — Encounter: Payer: Self-pay | Admitting: Physician Assistant

## 2022-01-31 VITALS — BP 112/72 | HR 77 | Temp 97.3°F | Resp 14 | Ht 62.0 in | Wt 172.0 lb

## 2022-01-31 DIAGNOSIS — E1165 Type 2 diabetes mellitus with hyperglycemia: Secondary | ICD-10-CM

## 2022-01-31 DIAGNOSIS — F17210 Nicotine dependence, cigarettes, uncomplicated: Secondary | ICD-10-CM

## 2022-01-31 DIAGNOSIS — Z0000 Encounter for general adult medical examination without abnormal findings: Secondary | ICD-10-CM

## 2022-01-31 DIAGNOSIS — E782 Mixed hyperlipidemia: Secondary | ICD-10-CM

## 2022-01-31 NOTE — Progress Notes (Signed)
Pt presents today to complete physical.  

## 2022-01-31 NOTE — Progress Notes (Signed)
? ?[facility] ? ?____________________________________________ ? ? None  ?  (approximate) ? ?I have reviewed the triage vital signs and the nursing notes. ? ? ?HISTORY ? ?Chief Complaint ?No chief complaint on file. ? ? ?HPI ?Ellen Butler is a 61 y.o. female patient presents for annual physical exam.  Patient as well as no concerning complaints.  Patient has a history of mixed hyperlipidemia but refused to take medication. ?   ? ?  ? ? ?Past Medical History:  ?Diagnosis Date  ? Anxiety   ? Asthma   ? Cellulitis of left arm 05/2016  ? COPD (chronic obstructive pulmonary disease) ([place])   ? Dependent edema   ? Diabetes mellitus without complication ([place])   ? Insomnia   ? Onychomycosis   ? Over weight   ? ? ?Patient Active Problem List  ? Diagnosis Date Noted  ? Diabetes ([place]) 10/03/2020  ? Right leg pain 08/02/2015  ? Hx of substance abuse ([place]) 10/01/2014  ? Pain syndrome, chronic 10/01/2014  ? 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 Chronic obstructive pulmonary disease, unspecified ([place]) 12/16/2013  ? ? ?Past Surgical History:  ?Procedure Laterality Date  ? FEMUR FRACTURE SURGERY    ? REPLACEMENT TOTAL KNEE    ? ? ?Prior to Admission medications   ?Medication Sig Start Date End Date Taking? Authorizing Provider  ?albuterol (VENTOLIN HFA) 108 (90 Base) MCG/ACT inhaler Inhale 2 puffs into the lungs every 6 (six) hours as needed for wheezing or shortness of breath. 06/06/21  Yes Sable Feil, PA-C  ?ALPRAZolam (XANAX) 0.25 MG tablet Take 0.25 mg by mouth daily as needed. Takes prn 11/08/19  Yes [provider]  ?blood glucose meter kit and supplies KIT Dispense based on patient and insurance preference. Use up to four times daily as directed. (FOR ICD-9 250.00, 250.01). 12/27/19  Yes Sable Feil, PA-C  ?Blood Glucose Monitoring Suppl (ACCU-CHEK AVIVA PLUS) w/Device KIT 1 Device by Does not apply route in the morning, at noon, and at bedtime. 08/09/21  Yes  Sable Feil, PA-C  ?Buprenorphine HCl-Naloxone HCl 5.7-1.4 MG SUBL Place under the tongue.   Yes [provider]  ?glucose blood test strip As directed 3 times a day 08/09/21  Yes Sable Feil, PA-C  ?Lancets (ACCU-CHEK MULTICLIX) lancets Use as instructed 08/09/21  Yes Sable Feil, PA-C  ?metFORMIN (GLUCOPHAGE-XR) 500 MG 24 hr tablet Take 2 tablets (1,000 mg total) by mouth 2 (two) times daily. 08/09/21  Yes Sable Feil, PA-C  ?nicotine (NICODERM CQ - DOSED IN MG/24 HOURS) 21 mg/24hr patch Place 1 patch (21 mg total) onto the skin daily. 01/16/22  Yes Sable Feil, PA-C  ?rosuvastatin (CRESTOR) 40 MG tablet Take 1 tablet (40 mg total) by mouth daily. 01/24/21  Yes Sable Feil, PA-C  ? ? ?Allergies ?Patient has no known allergies. ? ?Family History  ?Problem Relation Age of Onset  ? Parkinson's disease Mother   ? Diabetes Mother   ? Arthritis Mother   ? Diabetes Maternal Aunt   ? ? ?Social History ?Social History  ? ?Tobacco Use  ? Smoking status: Every Day  ? Smokeless tobacco: Never  ? ? ?Review of Systems ?Constitutional: No fever/chills ?Eyes: No visual changes. ?ENT: No sore throat. ?Cardiovascular: Denies chest pain. ?Respiratory: Denies shortness of breath. ?Gastrointestinal: No abdominal pain.  No nausea, no vomiting.  No diarrhea.  No constipation. ?Genitourinary: Negative for dysuria. ?Musculoskeletal: Negative for back pain. ?Skin: Negative  for rash. ?Neurological: Negative for headaches, focal weakness or numbness. ?Endocrine: COPD and diabetes.   ?____________________________________________ ? ? ?PHYSICAL EXAM: ? ?VITAL SIGNS: Temperature is 97.3, respiration 14, pulse 77, BP is 112/72.  O2 sat is 94% on room air.  Patient with 172 pounds and BMI is 31.46. ?Constitutional: Alert and oriented. Well appearing and in no acute distress. ?Eyes: Conjunctivae are normal. PERRL. EOMI. ?Head: Atraumatic. ?Nose: No congestion/rhinnorhea. ?Mouth/Throat: Mucous membranes are moist.   Oropharynx non-erythematous. ?Neck: No stridor.  No cervical spine tenderness to palpation. ?Hematological/Lymphatic/Immunilogical: No cervical lymphadenopathy. ?Cardiovascular: Normal rate, regular rhythm. Grossly normal heart sounds.  Good peripheral circulation. ?Respiratory: Normal respiratory effort.  No retractions. Lungs CTAB. ?Gastrointestinal: Soft and nontender. No distention. No abdominal bruits. No CVA tenderness. ?Genitourinary: Deferred ?Musculoskeletal: No lower extremity tenderness nor edema.  No joint effusions. ?Neurologic:  Normal speech and language. No gross focal neurologic deficits are appreciated. No gait instability. ?Skin:  Skin is warm, dry and intact. No rash noted. ?Psychiatric: Mood and affect are normal. Speech and behavior are normal. ? ?____________________________________________ ?  ?LABS ?______ ?       ?Component Ref Range & Units 8 d ago ?(01/23/22) 1 yr ago ?(01/18/21) 2 yr ago ?(12/27/19) 2 yr ago ?(11/04/19)  ?Color, UA  yellow  Yellow  Yellow  Yellow   ?Clarity, UA  cloudy  Clear  Clear  Clear   ?Glucose, UA Negative Negative  Negative  Negative  Positive Abnormal  CM   ?Bilirubin, UA  negative  Negative  Negative  Negative   ?Ketones, UA  negative  Negative  Negative  Negative   ?Spec Grav, UA 1.010 - 1.025 1.010  1.015  1.015  1.025   ?Blood, UA  negative  Negative  Negative  Negative   ?pH, UA 5.0 - 8.0 7.0  7.0  7.0  6.0   ?Protein, UA Negative Negative  Positive Abnormal  CM  Negative  Negative   ?Urobilinogen, UA 0.2 or 1.0 E.U./dL 0.2  0.2  0.2  0.2   ?Nitrite, UA  negative  Negative  Negative  Negative   ?Leukocytes, UA Negative Negative  Negative  Negative  Negative   ?Appearance          ?Odor          ?  ? ?  ?  ?Specimen Collected: 01/23/22 11:22 Last Resulted: 01/23/22 11:22  ?  ?  Lab Flowsheet   ? Order Details   ? View Encounter   ? Lab and Collection Details   ? Routing   ? Result History    ?View Encounter Conversation    ?  ?CM=Additional comments    ?  ?Result  Care Coordination ? ? ?Patient Communication ? ? Add Comments   Seen Back to Top  ?  ?  ? ?Other Results from 01/23/2022 ? ? Contains abnormal data CMP12+LP+TP+TSH+6AC+CBC/D/Plt ?Order: [id number] ?Status: Final result    ?Visible to patient: Yes (seen)    ?Next appt: 08/06/2022 at 09:00 AM in [facility] ([facility])    ?Dx: Routine adult health maintenance    ?0 Result Notes ?          ?Component Ref Range & Units 8 d ago ?(01/23/22) 5 mo ago ?(08/07/21) 9 mo ago ?(05/04/21) 1 yr ago ?(01/18/21) 1 yr ago ?(02/09/20) 2 yr ago ?(11/18/19) 2 yr ago ?(11/04/19)  ?Glucose 70 - 99 mg/dL 730 High     520 High  R  158 High  R  CANCELED R, CM  288 High  R   ?Uric Acid 3.0 - 7.2 mg/dL 3.9    3.4 CM  4.3 CM   2.7 Low  CM   ?Comment:            Therapeutic target for gout patients: <6.0  ?BUN 6 - 24 mg/dL $Remove'10    14  12   13   'AdiawPx$ ?Creatinine, Ser 0.57 - 1.00 mg/dL 0.66    0.70  0.63   0.66   ?eGFR >59 mL/min/1.73 102    101      ?BUN/Creatinine Ratio 9 - $R'23 15    20  19   20   'Md$ ?Sodium 134 - 144 mmol/L 145 High     136  140   137   ?Potassium 3.5 - 5.2 mmol/L 4.5    4.5  4.2   4.4   ?Chloride 96 - 106 mmol/L 101    98  101   100   ?Calcium 8.7 - 10.2 mg/dL 10.0    9.3  9.0   9.3   ?Phosphorus 3.0 - 4.3 mg/dL 3.5    3.3  3.7   3.5   ?Total Protein 6.0 - 8.5 g/dL 7.3    7.0  6.5   6.3   ?Albumin 3.8 - 4.9 g/dL 5.0 High     4.3  4.0   4.0   ?Globulin, Total 1.5 - 4.5 g/dL 2.3    2.7  2.5   2.3   ?Albumin/Globulin Ratio 1.2 - 2.2 2.2    1.6  1.6   1.7   ?Bilirubin Total 0.0 - 1.2 mg/dL 0.4    0.3  0.2   0.2   ?Alkaline Phosphatase 44 - 121 IU/L 116    117  99 R   115 R   ?LDH 119 - 226 IU/L 230 High     202  218   191   ?AST 0 - 40 IU/L $Remov'21    20  18   17   'mLLpcK$ ?ALT 0 - 32 IU/L $Remov'19    26  16   22   'alfsux$ ?GGT 0 - 60 IU/L $Remov'16    16  12   16   'QphBjh$ ?Iron 27 - 159 ug/dL 108    103  63   95   ?Cholesterol, Total 100 - 199 mg/dL 251 High   180  168  197  163   179   ?Triglycerides 0 - 149 mg/dL 215 High   129  208 High   295 High   164 High    221 High    ?HDL >39  mg/dL 54  51  41  42  37 Low    38 Low    ?VLDL Cholesterol Cal 5 - 40 mg/dL 39  23  35  50 High   29   38   ?LDL Chol Calc (NIH) 0 - 99 mg/dL 158 High   106 High   92  105 High   97   103 High    ?Chol/HDL R

## 2022-02-14 ENCOUNTER — Other Ambulatory Visit: Payer: Self-pay | Admitting: Physician Assistant

## 2022-03-05 ENCOUNTER — Other Ambulatory Visit: Payer: Self-pay | Admitting: Physician Assistant

## 2022-03-05 DIAGNOSIS — E1165 Type 2 diabetes mellitus with hyperglycemia: Secondary | ICD-10-CM

## 2022-03-05 DIAGNOSIS — E782 Mixed hyperlipidemia: Secondary | ICD-10-CM

## 2022-03-21 ENCOUNTER — Other Ambulatory Visit: Payer: Self-pay

## 2022-03-21 ENCOUNTER — Other Ambulatory Visit: Payer: Self-pay | Admitting: Physician Assistant

## 2022-03-21 DIAGNOSIS — E1165 Type 2 diabetes mellitus with hyperglycemia: Secondary | ICD-10-CM

## 2022-03-21 MED ORDER — ROSUVASTATIN CALCIUM 40 MG PO TABS: 40.0000 mg | ORAL_TABLET | Freq: Every day | ORAL | 3 refills | Status: DC

## 2022-03-21 MED ORDER — METFORMIN HCL ER 500 MG PO TB24: 1000.0000 mg | ORAL_TABLET | Freq: Two times a day (BID) | ORAL | 3 refills | Status: DC

## 2022-04-04 ENCOUNTER — Ambulatory Visit: Payer: 59 | Admitting: Physician Assistant

## 2022-04-04 ENCOUNTER — Encounter: Payer: Self-pay | Admitting: Physician Assistant

## 2022-04-04 VITALS — BP 123/68 | HR 92 | Resp 14 | Ht 62.0 in | Wt 170.0 lb

## 2022-04-04 DIAGNOSIS — H00011 Hordeolum externum right upper eyelid: Secondary | ICD-10-CM

## 2022-04-04 MED ORDER — NAPHCON-A 0.025-0.3 % OP SOLN: 1.0000 [drp] | Freq: Four times a day (QID) | OPHTHALMIC | 0 refills | Status: DC | PRN

## 2022-04-04 MED ORDER — NICOTINE 21 MG/24HR TD PT24
21.0000 mg | MEDICATED_PATCH | Freq: Every day | TRANSDERMAL | 1 refills | Status: DC
Start: 2022-04-04 — End: 2022-07-11

## 2022-04-04 MED ORDER — GENTAMICIN SULFATE 0.3 % OP SOLN: 1.0000 [drp] | OPHTHALMIC | 0 refills | Status: DC

## 2022-04-04 NOTE — Progress Notes (Signed)
   Subjective: Right eye pain    Patient ID: Ellen Butler, female    DOB: 03/27/1961, 61 y.o.   MRN: [id number]  HPI Patient presents with right right pain upon a.m. awakening.  Patient states there is a foreign body sensation under the upper right eyelid.  Denies vision change.  States mild itching.    Objective:   Physical Exam  BP is 123/68, pulse 92, respiration 14, temperature is 97, patient weighs 170 pounds and BMI is 31.09. Patient smells heavily of cigarette smoke. Right upper eyelid is erythematous.  Inversion of the eyelid shows a papular lesion.  Conjunctiva is slightly erythematous.  Eyes are PERRL and EOM's intact.     Assessment & Plan: Stye right upper eyelid   Patient given a prescription for gentamicin ophthalmic drops and Naphcon.  Patient given discharge care instruction list of warm compresses to the eye for approximately 5 minutes 4 times a day.  Patient advised return back in 5 days for reevaluation.  Return earlier if condition worsens.

## 2022-04-08 ENCOUNTER — Telehealth: Payer: Self-pay

## 2022-04-08 DIAGNOSIS — E039 Hypothyroidism, unspecified: Secondary | ICD-10-CM

## 2022-07-10 ENCOUNTER — Encounter: Payer: Self-pay | Admitting: Physician Assistant

## 2022-07-10 ENCOUNTER — Ambulatory Visit
Admission: RE | Admit: 2022-07-10 | Discharge: 2022-07-10 | Disposition: A | Discharge status: Home / Self Care | Payer: No Typology Code available for payment source | Attending: Physician Assistant | Admitting: Physician Assistant

## 2022-07-10 ENCOUNTER — Ambulatory Visit: Payer: Self-pay | Admitting: Physician Assistant

## 2022-07-10 ENCOUNTER — Ambulatory Visit
Admission: RE | Admit: 2022-07-10 | Discharge: 2022-07-10 | Disposition: A | Discharge status: Home / Self Care | Payer: No Typology Code available for payment source | Source: Ambulatory Visit | Attending: Physician Assistant | Admitting: Physician Assistant

## 2022-07-10 VITALS — BP 137/76 | HR 119 | Temp 97.5°F | Resp 16 | Wt 169.0 lb

## 2022-07-10 DIAGNOSIS — M79621 Pain in right upper arm: Secondary | ICD-10-CM | POA: Insufficient documentation

## 2022-07-10 DIAGNOSIS — W19XXXA Unspecified fall, initial encounter: Secondary | ICD-10-CM | POA: Insufficient documentation

## 2022-07-10 NOTE — Progress Notes (Signed)
Here for Worker's Comp injury on 07/06/22 with a fall and reported only complaint of right arm from bracing fall, but did not report until 07/10/22.  No broken skin and full ROM to right hand and right forearm, but noted limited and painful ROM to right upper arm stated level 5 on movement.

## 2022-07-10 NOTE — Progress Notes (Signed)
   Subjective: Right upper arm pain    Patient ID: Emree Locicero, female    DOB: 10/19/1960, 61 y.o.   MRN: [id number]  HPI Patient complain of right upper arm pain with decreased range of motion with overhead reaching secondary to a fall which occurred on 07/06/2022.  Incident happened at work.  Patient states pain has increased to a 5/10.  Described pain as "achy".  No palliative measure for complaint.   Review of Systems COPD and diabetes.    Objective:   Physical Exam No acute distress. BP is 137/76, patient's tachycardia 119, respirations 16, temperature 97.5, patient 94% O2 sat on room air.  Patient weighs 169 pounds and BMI is 30.91. Patient is right-hand dominant.  No obvious deformity edema or erythema to the right upper arm.  Patient has moderate guarding with palpation at the humeral head.  Patient has decreased range of motion with abduction and overhead reaching.  Strength against resistance is 3/5.       Assessment & Plan: Right upper arm pain  Patient placed on modified duty and was sent to get x-ray.  Patient will follow-up tomorrow for reevaluation.

## 2022-07-11 ENCOUNTER — Ambulatory Visit: Payer: Self-pay | Admitting: Physician Assistant

## 2022-07-11 VITALS — BP 139/72 | HR 84 | Temp 97.8°F | Resp 16

## 2022-07-11 DIAGNOSIS — M25511 Pain in right shoulder: Secondary | ICD-10-CM

## 2022-07-11 MED ORDER — NAPROXEN 500 MG PO TABS: 500.0000 mg | ORAL_TABLET | Freq: Two times a day (BID) | ORAL | Status: DC

## 2022-07-11 MED ORDER — ORPHENADRINE CITRATE ER 100 MG PO TB12: 100.0000 mg | ORAL_TABLET | Freq: Two times a day (BID) | ORAL | 0 refills | Status: DC

## 2022-07-11 NOTE — Progress Notes (Signed)
Pt presents today for WC initial visit.

## 2022-07-11 NOTE — Progress Notes (Signed)
   Subjective: Right shoulder pain    Patient ID: Ellen Butler, female    DOB: 09-04-1961, 61 y.o.   MRN: [id number]  HPI Patient followed for right shoulder pain secondary to a fall which occurred on 07/06/2022.  This is a work-related injury.  Patient states pain increased with overhead reaching.  Patient was sent for imaging just today is here for evaluation.  States no change in complaint.  Patient points to the [facility] joint as a source of complaint.   Review of Systems COPD    Objective:   Physical Exam  BP is 139/72, pulse 84, respirations 16, temperature 97.8, and patient 97% O2 sat on room air. Patient is right-hand dominant.  Knee examination shows again shows no obvious deformity edema or erythema.  Patient has moderate guarding palpation GH joint.  Definitive x-ray report has not been completed.  No obvious fracture visible on film.      Assessment & Plan: Right shoulder pain.  Advised patient no obvious fracture on x-ray, and will notify her if official report reveals no findings.  Patient had a Lidoderm patch applied prior to departure.  Patient given a prescription for naproxen and Norflex.  Patient will follow-up in 4 days for reevaluation.

## 2022-07-12 ENCOUNTER — Other Ambulatory Visit: Payer: Self-pay

## 2022-07-12 DIAGNOSIS — M79621 Pain in right upper arm: Secondary | ICD-10-CM

## 2022-07-12 DIAGNOSIS — M25511 Pain in right shoulder: Secondary | ICD-10-CM

## 2022-07-12 MED ORDER — NAPROXEN 500 MG PO TABS: 500.0000 mg | ORAL_TABLET | Freq: Two times a day (BID) | ORAL | Status: DC

## 2022-07-12 MED ORDER — ORPHENADRINE CITRATE ER 100 MG PO TB12: 100.0000 mg | ORAL_TABLET | Freq: Two times a day (BID) | ORAL | 0 refills | Status: DC

## 2022-07-17 ENCOUNTER — Ambulatory Visit: Payer: Self-pay | Admitting: Physician Assistant

## 2022-07-17 ENCOUNTER — Encounter: Payer: Self-pay | Admitting: Physician Assistant

## 2022-07-17 VITALS — BP 132/70 | HR 105 | Temp 97.3°F | Resp 16

## 2022-07-17 DIAGNOSIS — E1165 Type 2 diabetes mellitus with hyperglycemia: Secondary | ICD-10-CM

## 2022-07-17 DIAGNOSIS — M25511 Pain in right shoulder: Secondary | ICD-10-CM

## 2022-07-17 LAB — POCT GLYCOSYLATED HEMOGLOBIN (HGB A1C): Hemoglobin A1C: 6.1 % — AB (ref 4.0–5.6)

## 2022-07-17 MED ORDER — LIDOCAINE 5 % EX PTCH: 1.0000 | MEDICATED_PATCH | CUTANEOUS | 0 refills | Status: DC

## 2022-07-17 NOTE — Progress Notes (Signed)
Follow up Wcomp to see provider.  Stated right upper arm/shoulder pain 3 at rest and 6 at movement with limited ROM to raise right arm.  Stated Naprosyn with some effectiveness.  POC A1C completed.

## 2022-07-17 NOTE — Progress Notes (Signed)
   Subjective: Right shoulder pain    Patient ID: Ellen Butler, female    DOB: 04-11-1961, 61 y.o.   MRN: [id number]  HPI Patient follow-up for right shoulder pain secondary to fall which occurred on 07/06/2022.  Patient states not much change since previous exam on 07/10/2022.  Patient states pharmacy did not have the Norflex but she has been taking the naproxen.   Review of Systems Negative except for chief complaint    Objective:   Physical Exam No acute distress.  Right-hand-dominant. BP is 132/70, pulse 105, respirations 16, 97.3, patient 98% O2 sat on room air. Examination of the right upper extremity reveals no obvious deformity.  There is developing ecchymosis at point of impact.  Patient has decreased range of motion of abduction and overhead reaching.  Strength against resistance 3/5.  Neurovascular intact.       Assessment & Plan: Right shoulder pain  Patient placed on modified duty for 1 week.  Patient advised continue taking naproxen as directed.  Patient given a prescription Lidoderm patches.

## 2022-07-24 ENCOUNTER — Ambulatory Visit: Payer: Self-pay | Admitting: Physician Assistant

## 2022-07-24 ENCOUNTER — Encounter: Payer: Self-pay | Admitting: Physician Assistant

## 2022-07-24 DIAGNOSIS — M25511 Pain in right shoulder: Secondary | ICD-10-CM

## 2022-07-24 NOTE — Progress Notes (Signed)
   Subjective: Right shoulder pain    Patient ID: Ellen Butler, female    DOB: 1961-09-08, 61 y.o.   MRN: [id number]  HPI Patient complaining of pain with overhead reaching status post fall which occurred on 07/06/2022.  Patient describes the pain as "achy".  No noticeable relief with NSAIDs and muscle relaxants.   Review of Systems Negative septa chief complaint    Objective:   Physical Exam  Patient is right-hand dominant.  Review of x-ray shows no obvious disease. No obvious deformity to the shoulder.  Patient has decreased range of motion abduction and overhead reaching.  Palpation of the [place] joint.      Assessment & Plan: Right shoulder pain   Discussed with patient to have evaluation by physical therapy for definitive evaluation and treatment no bony abnormality patient may benefit from physical therapy.  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

## 2022-07-25 ENCOUNTER — Other Ambulatory Visit: Payer: Self-pay

## 2022-07-25 DIAGNOSIS — M25511 Pain in right shoulder: Secondary | ICD-10-CM

## 2022-07-25 NOTE — Progress Notes (Signed)
Physical Therapy eval and treat ordered by provider for right shoulder pain and limited ROM post workers comp accident.

## 2022-07-30 ENCOUNTER — Ambulatory Visit: Payer: Self-pay | Admitting: Physician Assistant

## 2022-07-30 ENCOUNTER — Encounter: Payer: Self-pay | Admitting: Physician Assistant

## 2022-07-30 DIAGNOSIS — M25512 Pain in left shoulder: Secondary | ICD-10-CM

## 2022-07-30 MED ORDER — METHYLPREDNISOLONE 4 MG PO TBPK: ORAL_TABLET | ORAL | 0 refills | Status: DC

## 2022-07-30 NOTE — Progress Notes (Signed)
Pt states Sunday the 15th. She woke up with left shoulder pain with no injury she feels like its getting worse.

## 2022-07-30 NOTE — Progress Notes (Signed)
   Subjective: Left shoulder pain    Patient ID: Ellen Butler, female    DOB: [birth year], 61 y.o.   MRN: [id number]  HPI Patient complain of left shoulder pain for 2 days.  Patient states he awakened in the morning with pain with no provocative incident.  Patient states she is currently being seen under Worker's Compensation for right shoulder pain secondary to a fall.  Patient states she has dependent most on her left upper extremities due to the pain in her right shoulder.  Patient believes compensating for the right shoe is causing increased pain in the left shoulder.  States decreased range of motion similar to right shoulder with adduction and overhead reaching.  Patient points to the lateral humeral head and GH joint as a source of pain.   Review of Systems COPD and diabetes.    Objective:   Physical Exam  See nurses note for vital signs. Examination left shoulder shows no obvious deformity, edema, erythema, or ecchymosis.  Patient has moderate guarding with palpation of the lateral superior humeral head and the [place] joint.  Decreased range of motion with adduction and overhead reaching, limited by complaint of pain.Marland Kitchen  Neurovascular intact.      Assessment & Plan:  Differential diagnosis consist of pain secondary to overuse syndrome, bursitis, or tendinitis.  Patient given discharge care instructions and a prescription for Medrol Dosepak.  Lidoderm patch was applied prior to departure.  Advised to have both shoulders evaluated with full upcoming orthopedic/PT consult.

## 2022-08-01 NOTE — Addendum Note (Signed)
Addended by: Aliene Altes on: 08/01/2022 10:37 AM   Modules accepted: Orders

## 2022-08-06 ENCOUNTER — Encounter: Payer: Self-pay | Admitting: Physician Assistant

## 2022-08-06 ENCOUNTER — Ambulatory Visit: Payer: Self-pay | Admitting: Physician Assistant

## 2022-08-06 VITALS — BP 117/54 | HR 80 | Temp 97.3°F | Resp 12

## 2022-08-06 DIAGNOSIS — M25511 Pain in right shoulder: Secondary | ICD-10-CM

## 2022-08-06 DIAGNOSIS — Z23 Encounter for immunization: Secondary | ICD-10-CM

## 2022-08-06 DIAGNOSIS — E1165 Type 2 diabetes mellitus with hyperglycemia: Secondary | ICD-10-CM

## 2022-08-06 NOTE — Progress Notes (Signed)
   Subjective: Right shoulder pain    Patient ID: Ellen Butler, female    DOB: 1961-04-15, 61 y.o.   MRN: [id number]  HPI Patient is follow-up for right shoulder pain secondary to a fall which occurred at work on 07/06/2022.  Patient states no improvement with conservative and light duty since incident.  Patient stated no relief with anti-inflammatory and NSAIDs.  Patient state she developed pain to the left shoulder which she believes is secondary to compensating to limit use of right upper extremity.  Patient states she has noticed moderate relief after starting prednisone.  Patient was evaluated by about physical therapist and will start treatment this week.   Review of Systems Negative except for chief complaint    Objective:   Physical Exam BP is 117/54, pulse 80, respiration 12, temperature 97.3, patient 97% O2 sat on room air. Patient is right hand dominant.  No obvious deformity, edema, erythema, or ecchymosis.  Patient has decreased range of motion of abduction and overhead reaching.  Patient has moderate guarding palpation the [place] joint.  Neurovascular intact.       Assessment & Plan: Right shoulder pain  Patient will continue restricted duties pending treatment and final evaluation by physical therapist.

## 2022-08-06 NOTE — Progress Notes (Signed)
PT 3x a week States she's been feeling better since taking the sterroids. Pain radiates up to the neck.  States left shoulder pain has improved since last week. As fast as the pain came on, it has went away.  AMD

## 2022-08-13 ENCOUNTER — Other Ambulatory Visit: Payer: 59 | Admitting: Physician Assistant

## 2022-08-14 ENCOUNTER — Ambulatory Visit: Payer: Self-pay | Admitting: Physician Assistant

## 2022-08-14 ENCOUNTER — Encounter: Payer: Self-pay | Admitting: Physician Assistant

## 2022-08-14 VITALS — BP 117/55 | HR 80

## 2022-08-14 DIAGNOSIS — M25511 Pain in right shoulder: Secondary | ICD-10-CM

## 2022-08-14 NOTE — Progress Notes (Signed)
Pt presents today for follow up [place] DOI: 9-23--23 Pt states physical therapy is helping but still painful.

## 2022-08-14 NOTE — Progress Notes (Signed)
   Subjective: Right shoulder pain    Patient ID: Ellen Butler, female    DOB: [birth year], 61 y.o.   MRN: [id number]  HPI Patient is following up for right shoulder pain which occurred on 07/06/2022 secondary to to a fall at work.  Patient started physical therapy last week and state noticed mild improvement with range of motion.   Review of Systems Negative septa chief complaint    Objective:   Physical Exam BP is 117/55 pulse 80 patient 96% O2 sat on room air. Patient is right-hand dominant. No obvious deformity to the left shoulder.  Patient has increased range of motion with abduction but still limited range of motion with overhead reaching.  Strength against resistance 3/5.  Neurovascular intact.       Assessment & Plan: Right shoulder pain  Continue physical therapy and follow-up in 2 weeks.

## 2022-08-20 LAB — LIPID PANEL
Chol/HDL Ratio: 3.1 ratio (ref 0.0–4.4)
Cholesterol, Total: 166 mg/dL (ref 100–199)
HDL: 54 mg/dL (ref >39)
LDL Chol Calc (NIH): 90 mg/dL (ref 0–99)
Triglycerides: 124 mg/dL (ref 0–149)
VLDL Cholesterol Cal: 22 mg/dL (ref 5–40)

## 2022-08-20 LAB — RABIES NEUT.ABS TITRAT.(RFFIT)

## 2022-08-20 LAB — HGB A1C W/O EAG: Hgb A1c MFr Bld: 6.8 % — ABNORMAL HIGH (ref 4.8–5.6)

## 2022-08-28 ENCOUNTER — Other Ambulatory Visit: Payer: Self-pay | Admitting: Physician Assistant

## 2022-09-03 ENCOUNTER — Other Ambulatory Visit: Payer: Self-pay

## 2022-09-03 ENCOUNTER — Encounter: Payer: Self-pay | Admitting: Physician Assistant

## 2022-09-03 ENCOUNTER — Ambulatory Visit: Payer: Self-pay | Admitting: Physician Assistant

## 2022-09-03 VITALS — BP 114/62 | HR 68 | Temp 98.2°F | Resp 16

## 2022-09-03 DIAGNOSIS — M25511 Pain in right shoulder: Secondary | ICD-10-CM

## 2022-09-03 NOTE — Progress Notes (Signed)
Pt requests to continue additional PT orders at current [facility] in [place], [place] and to be sent to [facility] with current workers comp injury right shoulder.

## 2022-09-03 NOTE — Progress Notes (Signed)
States PT is working.  Therapist thinks she might have a small tear, but told she would still do PT & wouldn't do anything different.  AMD

## 2022-09-03 NOTE — Progress Notes (Signed)
   Subjective: Right shoulder pain    Patient ID: Ellen Butler, female    DOB: [birth year], 61 y.o.   MRN: [id number]  HPI Patient continue to follow-up for right shoulder pain secondary to a fall which occurred on 07/06/2022.  Patient has completed 6 weeks of physical therapy with mild to moderate improvement, but has not been treatment goals.  Review of Systems Negative except for chief complaint    Objective:   Physical Exam BP is 114/62, pulse 68, respirations 16, temperature 90.2, patient 90% O2 sat on room air. Patient is right-hand dominant.  Decreased range of motion with flexion and abduction.  Patient has moderate guarding with palpation of right GH joint.       Assessment & Plan: Right shoulder pain and stiffness   Per physical therapy recommendation advised to continue therapy 2 times a week for the next 6 weeks.  Continue light duty and follow-up in 6 weeks.

## 2022-09-03 NOTE — Addendum Note (Signed)
Addended by: Hansel Feinstein on: 09/03/2022 03:25 PM   Modules accepted: Orders

## 2022-09-03 NOTE — Addendum Note (Signed)
Addended by: Hansel Feinstein on: 09/03/2022 11:16 AM   Modules accepted: Orders

## 2022-10-10 ENCOUNTER — Other Ambulatory Visit: Payer: Self-pay | Admitting: Physician Assistant

## 2022-10-10 DIAGNOSIS — J449 Chronic obstructive pulmonary disease, unspecified: Secondary | ICD-10-CM

## 2022-10-16 ENCOUNTER — Ambulatory Visit: Payer: Self-pay | Admitting: Physician Assistant

## 2022-10-16 ENCOUNTER — Encounter: Payer: Self-pay | Admitting: Physician Assistant

## 2022-10-16 DIAGNOSIS — M25511 Pain in right shoulder: Secondary | ICD-10-CM

## 2022-10-16 MED ORDER — METHYLPREDNISOLONE 4 MG PO TBPK: ORAL_TABLET | ORAL | 0 refills | Status: DC

## 2022-10-16 NOTE — Progress Notes (Signed)
/  Pt presents today for follow up on work related injury '[facility]' Date of injury: 07/06/22

## 2022-10-16 NOTE — Progress Notes (Signed)
   Subjective: Right shoulder pain    Patient ID: Ellen Butler, female    DOB: Jul 05, 1961, 62 y.o.   MRN: [id number]  HPI Patient complain of right upper arm and shoulder pain with decreased range of motion secondary to a near fall which occurred on 07/06/2022.  No acute findings on right shoulder x-rays.  Patient was referred to physical therapy and has made only mild improvement in the past 2 months.   Review of Systems Negative except for chief complaint   Objective:   Physical Exam  No obvious deformity to the right shoulder.  Patient is neurovascular intact.  Patient has decreased range of motion of abduction and overhead reaching.  Review of physical therapy note shows increased passive range of motion with adduction and overhead reaching.    Assessment & Plan: Right shoulder pain.   Considering the mechanism of injury, negative x-ray findings, and patient limited response to physical therapy recommend evaluating patient for MMI/impairment rating.

## 2022-10-30 ENCOUNTER — Other Ambulatory Visit: Payer: Self-pay | Admitting: Physician Assistant

## 2022-10-31 DIAGNOSIS — M25511 Pain in right shoulder: Secondary | ICD-10-CM | POA: Insufficient documentation

## 2022-11-22 DIAGNOSIS — Z01818 Encounter for other preprocedural examination: Secondary | ICD-10-CM | POA: Insufficient documentation

## 2023-01-14 DIAGNOSIS — G8918 Other acute postprocedural pain: Secondary | ICD-10-CM | POA: Insufficient documentation

## 2023-02-20 ENCOUNTER — Ambulatory Visit: Payer: Self-pay

## 2023-02-20 DIAGNOSIS — E1165 Type 2 diabetes mellitus with hyperglycemia: Secondary | ICD-10-CM

## 2023-02-20 DIAGNOSIS — Z0000 Encounter for general adult medical examination without abnormal findings: Secondary | ICD-10-CM

## 2023-02-20 LAB — POCT URINALYSIS DIPSTICK
Bilirubin, UA: NEGATIVE
Blood, UA: NEGATIVE
Glucose, UA: NEGATIVE
Ketones, UA: NEGATIVE
Leukocytes, UA: NEGATIVE
Nitrite, UA: NEGATIVE
Protein, UA: NEGATIVE
Spec Grav, UA: 1.02 (ref 1.010–1.025)
Urobilinogen, UA: 0.2 E.U./dL
pH, UA: 6 (ref 5.0–8.0)

## 2023-02-20 NOTE — Progress Notes (Signed)
Fasting labs, urine and EKG obtained pre physical for COB.

## 2023-02-21 LAB — CMP12+LP+TP+TSH+6AC+CBC/D/PLT
ALT: 16 IU/L (ref 0–32)
AST: 19 IU/L (ref 0–40)
Albumin/Globulin Ratio: 2.1 (ref 1.2–2.2)
Albumin: 4.6 g/dL (ref 3.8–4.9)
Alkaline Phosphatase: 76 IU/L (ref 44–121)
BUN/Creatinine Ratio: 28 — ABNORMAL HIGH (ref 9–23)
BUN: 19 mg/dL (ref 6–24)
Basophils Absolute: 0.1 10*3/uL (ref 0.0–0.2)
Basos: 1 %
Bilirubin Total: 0.3 mg/dL (ref 0.0–1.2)
Calcium: 9.6 mg/dL (ref 8.7–10.2)
Chloride: 102 mmol/L (ref 96–106)
Chol/HDL Ratio: 4.2 ratio (ref 0.0–4.4)
Cholesterol, Total: 194 mg/dL (ref 100–199)
Creatinine, Ser: 0.67 mg/dL (ref 0.57–1.00)
EOS (ABSOLUTE): 0.2 10*3/uL (ref 0.0–0.4)
Eos: 3 %
Estimated CHD Risk: 1 times avg. (ref 0.0–1.0)
Free Thyroxine Index: 2 (ref 1.2–4.9)
GGT: 10 IU/L (ref 0–60)
Globulin, Total: 2.2 g/dL (ref 1.5–4.5)
Glucose: 129 mg/dL — ABNORMAL HIGH (ref 70–99)
HDL: 46 mg/dL (ref >39)
Hematocrit: 40.4 % (ref 34.0–46.6)
Hemoglobin: 13.8 g/dL (ref 11.1–15.9)
Immature Grans (Abs): 0 10*3/uL (ref 0.0–0.1)
Immature Granulocytes: 0 %
Iron: 93 ug/dL (ref 27–159)
LDH: 187 IU/L (ref 119–226)
LDL Chol Calc (NIH): 121 mg/dL — ABNORMAL HIGH (ref 0–99)
Lymphocytes Absolute: 2.4 10*3/uL (ref 0.7–3.1)
Lymphs: 28 %
MCH: 31.7 pg (ref 26.6–33.0)
MCHC: 34.2 g/dL (ref 31.5–35.7)
MCV: 93 fL (ref 79–97)
Monocytes Absolute: 0.7 10*3/uL (ref 0.1–0.9)
Monocytes: 8 %
Neutrophils Absolute: 5.1 10*3/uL (ref 1.4–7.0)
Neutrophils: 60 %
Phosphorus: 3.8 mg/dL (ref 3.0–4.3)
Platelets: 260 10*3/uL (ref 150–450)
Potassium: 4.5 mmol/L (ref 3.5–5.2)
RBC: 4.36 x10E6/uL (ref 3.77–5.28)
RDW: 12.3 % (ref 11.7–15.4)
Sodium: 142 mmol/L (ref 134–144)
T3 Uptake Ratio: 28 % (ref 24–39)
T4, Total: 7.2 ug/dL (ref 4.5–12.0)
TSH: 3.76 u[IU]/mL (ref 0.450–4.500)
Total Protein: 6.8 g/dL (ref 6.0–8.5)
Triglycerides: 154 mg/dL — ABNORMAL HIGH (ref 0–149)
Uric Acid: 3.7 mg/dL (ref 3.0–7.2)
VLDL Cholesterol Cal: 27 mg/dL (ref 5–40)
WBC: 8.5 10*3/uL (ref 3.4–10.8)
eGFR: 101 mL/min/{1.73_m2} (ref >59)

## 2023-02-21 LAB — HGB A1C W/O EAG: Hgb A1c MFr Bld: 6.7 % — ABNORMAL HIGH (ref 4.8–5.6)

## 2023-03-05 ENCOUNTER — Ambulatory Visit: Payer: 59 | Admitting: Physician Assistant

## 2023-03-06 ENCOUNTER — Other Ambulatory Visit: Payer: Self-pay | Admitting: Physician Assistant

## 2023-03-06 ENCOUNTER — Encounter: Payer: Self-pay | Admitting: Physician Assistant

## 2023-03-06 ENCOUNTER — Ambulatory Visit: Payer: Self-pay | Admitting: Physician Assistant

## 2023-03-06 VITALS — BP 137/77 | HR 87 | Resp 16 | Ht 62.0 in | Wt 165.0 lb

## 2023-03-06 DIAGNOSIS — Z0000 Encounter for general adult medical examination without abnormal findings: Secondary | ICD-10-CM

## 2023-03-06 DIAGNOSIS — E1165 Type 2 diabetes mellitus with hyperglycemia: Secondary | ICD-10-CM

## 2023-03-06 DIAGNOSIS — I781 Nevus, non-neoplastic: Secondary | ICD-10-CM

## 2023-03-06 DIAGNOSIS — J449 Chronic obstructive pulmonary disease, unspecified: Secondary | ICD-10-CM

## 2023-03-06 MED ORDER — ALBUTEROL SULFATE HFA 108 (90 BASE) MCG/ACT IN AERS
2.0000 | INHALATION_SPRAY | Freq: Four times a day (QID) | RESPIRATORY_TRACT | 5 refills | Status: AC | PRN
Start: 2023-03-06 — End: ?

## 2023-03-06 NOTE — Progress Notes (Signed)
Information given for contact to [facility] at request for screening with skin.

## 2023-03-06 NOTE — Addendum Note (Signed)
Addended by: Gardner Candle on: 03/06/2023 10:02 AM   Modules accepted: Orders
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
## 2023-05-05 ENCOUNTER — Other Ambulatory Visit: Payer: Self-pay

## 2023-05-05 DIAGNOSIS — E1165 Type 2 diabetes mellitus with hyperglycemia: Secondary | ICD-10-CM

## 2023-05-05 MED ORDER — METFORMIN HCL ER 500 MG PO TB24
1000.0000 mg | ORAL_TABLET | Freq: Two times a day (BID) | ORAL | 3 refills | Status: DC
Start: 2023-05-05 — End: 2024-02-23

## 2023-08-06 ENCOUNTER — Encounter: Payer: Self-pay | Admitting: Physician Assistant

## 2023-08-06 ENCOUNTER — Ambulatory Visit: Payer: Self-pay | Admitting: Physician Assistant

## 2023-08-06 VITALS — BP 119/77 | HR 103 | Temp 97.3°F | Resp 16 | Ht 62.0 in | Wt 172.0 lb

## 2023-08-06 DIAGNOSIS — Z7689 Persons encountering health services in other specified circumstances: Secondary | ICD-10-CM

## 2023-08-06 MED ORDER — PHENTERMINE HCL 30 MG PO CAPS: 30.0000 mg | ORAL_CAPSULE | ORAL | 0 refills | Status: DC

## 2023-08-06 NOTE — Progress Notes (Signed)
   Subjective:     Patient ID: Ellen Butler, female    DOB: 1961/09/26, 62 y.o.   MRN: [id number]  HPI Patient presents today to discuss pharmacological options for weight management. Patient reports attempting dieting and increased physical activity for weight loss without success. Pt is currently utilizing [employer] products to boost protein intake.   ROS Negative except for above complaint.      Objective:    BP 119/77   Pulse (!) 103   Temp (!) 97.3 F (36.3 C)   Resp 16   Ht 5\' 2"  (1.575 m)   Wt 172 lb (78 kg)   SpO2 94%   BMI 31.46 kg/m    Physical Exam Constitutional:      Appearance: She is not ill-appearing.  Cardiovascular:     Rate and Rhythm: Normal rate and regular rhythm.     Heart sounds: Normal heart sounds.  Pulmonary:     Effort: Pulmonary effort is normal.     Breath sounds: Normal breath sounds.  Skin:    Capillary Refill: Capillary refill takes less than 2 seconds.  Neurological:     General: No focal deficit present.     Mental Status: She is alert. Mental status is at baseline.  Psychiatric:        Mood and Affect: Mood normal.        Behavior: Behavior normal.       Assessment & Plan:   1.Encounter for weight management Phentermine prescribed.  Patient educated on medication usage and potential/adverse side effects.   Follow-Up Follow up in 1 month for medication and weight management. Contact clinic with any needs or concerns.   Delma Post, RN

## 2023-08-06 NOTE — Progress Notes (Signed)
Requests to talk to provider about meds for weight loss.  Stated stuck at weight.  Stated trying to improve drinking water, but has trouble drinking water and using protein shakes.  States lost inches, but stays at 169/170 lb.

## 2023-09-04 ENCOUNTER — Other Ambulatory Visit: Payer: Self-pay | Admitting: Physician Assistant

## 2023-09-04 ENCOUNTER — Encounter: Payer: Self-pay | Admitting: Physician Assistant

## 2023-09-04 ENCOUNTER — Other Ambulatory Visit: Payer: Self-pay

## 2023-09-04 ENCOUNTER — Ambulatory Visit: Payer: Self-pay | Admitting: Physician Assistant

## 2023-09-04 VITALS — BP 132/72 | HR 89 | Resp 16 | Ht 62.0 in | Wt 161.2 lb

## 2023-09-04 DIAGNOSIS — Z7689 Persons encountering health services in other specified circumstances: Secondary | ICD-10-CM

## 2023-09-04 MED ORDER — PHENTERMINE HCL 30 MG PO CAPS
30.0000 mg | ORAL_CAPSULE | ORAL | 0 refills | Status: DC
Start: 2023-09-04 — End: 2023-10-02

## 2023-09-04 NOTE — Progress Notes (Signed)
   Subjective: Weight management    Patient ID: Ellen Butler, female    DOB: May 12, 1961, 62 y.o.   MRN: [id number]  HPI Patient presents for weight check and management secondary to starting phentermine for weight control.  Patient is lost 10 pounds at 30 mg.  Voices no concerns, complaints , or side effects of medication   Review of Systems Diabetes and COPD.    Objective:   Physical Exam  BP 132/72  Pulse 89  Resp 16  SpO2 97 %  Weight 161 lb 3.2 oz (73.1 kg)  Height 5\' 2"  (1.575 m)   BMI 29.48 kg/m2  BSA 1.79 m2        Assessment & Plan: Weight management  Continue phentermine 30 mg and follow-up in 1 month.

## 2023-09-04 NOTE — Progress Notes (Signed)
Pt presents today for weight check. Weighs in at 161. Previous 172

## 2023-10-02 ENCOUNTER — Ambulatory Visit: Payer: Self-pay | Admitting: Physician Assistant

## 2023-10-02 ENCOUNTER — Encounter: Payer: Self-pay | Admitting: Physician Assistant

## 2023-10-02 VITALS — BP 130/68 | HR 109 | Resp 16 | Ht 62.0 in | Wt 160.8 lb

## 2023-10-02 DIAGNOSIS — Z7689 Persons encountering health services in other specified circumstances: Secondary | ICD-10-CM

## 2023-10-02 MED ORDER — PHENTERMINE HCL 37.5 MG PO CAPS: 37.5000 mg | ORAL_CAPSULE | ORAL | 0 refills | Status: DC

## 2023-10-02 NOTE — Progress Notes (Signed)
   Subjective: Weight management    Patient ID: Ellen Butler, female    DOB: 09-07-1961, 62 y.o.   MRN: [id number]  HPI Patient presents for monthly weigh-in.  Patient started for ketamine 2 months ago.  Patient lost 11 pounds since starting medication.  Denies any side effects.  It is noticeable to patient only lost 1 pound from her last visit.   Review of Systems COPD and diabetes    Objective:   Physical Exam BP 130/68  Cuff Size Large  Pulse Rate 109Pulse Rate. 109. Data is abnormal. Taken on 10/02/23 9:03 AM  Weight 160 lb 12.8 oz (72.9 kg)  Height 5\' 2"  (1.575 m)  Resp 16         Assessment & Plan: Weight management  Will increase phentermine to 37.5 and follow-up in 1 month.

## 2023-10-02 NOTE — Progress Notes (Signed)
Pt presents today for weight check. Pt weighs in at 160.8.  Previous weight 161.3

## 2023-10-30 ENCOUNTER — Ambulatory Visit: Payer: Self-pay

## 2023-10-30 ENCOUNTER — Encounter: Payer: Self-pay | Admitting: Physician Assistant

## 2023-10-30 ENCOUNTER — Ambulatory Visit: Payer: Self-pay | Admitting: Physician Assistant

## 2023-10-30 VITALS — BP 126/63 | HR 98 | Temp 97.1°F | Resp 16 | Wt 162.0 lb

## 2023-10-30 DIAGNOSIS — M24469 Recurrent dislocation, unspecified knee: Secondary | ICD-10-CM | POA: Insufficient documentation

## 2023-10-30 DIAGNOSIS — Z0000 Encounter for general adult medical examination without abnormal findings: Secondary | ICD-10-CM

## 2023-10-30 DIAGNOSIS — M1250 Traumatic arthropathy, unspecified site: Secondary | ICD-10-CM | POA: Insufficient documentation

## 2023-10-30 DIAGNOSIS — Z7689 Persons encountering health services in other specified circumstances: Secondary | ICD-10-CM

## 2023-10-30 DIAGNOSIS — S83509A Sprain of unspecified cruciate ligament of unspecified knee, initial encounter: Secondary | ICD-10-CM | POA: Insufficient documentation

## 2023-10-30 DIAGNOSIS — M19211 Secondary osteoarthritis, right shoulder: Secondary | ICD-10-CM | POA: Insufficient documentation

## 2023-10-30 DIAGNOSIS — E1165 Type 2 diabetes mellitus with hyperglycemia: Secondary | ICD-10-CM

## 2023-10-30 LAB — POCT URINALYSIS DIPSTICK
Bilirubin, UA: NEGATIVE
Blood, UA: NEGATIVE
Glucose, UA: NEGATIVE
Ketones, UA: NEGATIVE
Leukocytes, UA: NEGATIVE
Nitrite, UA: NEGATIVE
Protein, UA: POSITIVE — AB
Spec Grav, UA: 1.01 (ref 1.010–1.025)
Urobilinogen, UA: 0.2 U/dL
pH, UA: 7.5 (ref 5.0–8.0)

## 2023-10-30 MED ORDER — PHENTERMINE HCL 37.5 MG PO CAPS: 37.5000 mg | ORAL_CAPSULE | ORAL | 0 refills | Status: DC

## 2023-10-30 NOTE — Progress Notes (Signed)
Fasting labs, urine and EKG obtained pre physical for COB and tolerated well.

## 2023-10-30 NOTE — Progress Notes (Signed)
   Subjective: Weight management    Patient ID: Ellen Butler, female    DOB: 02-03-1961, 63 y.o.   MRN: [id number]  HPI Patient presents to clinic 1 month after starting phentermine for weight management.   Review of Systems COPD and diabetes.    Objective:   Physical Exam BP 126/63  Pulse Rate 98  Temp 97.1 F (36.2 C)Temp. 97.1 F (36.2 C). Data is abnormal. Taken on 10/30/23 8:55 AM  Weight 162 lb (73.5 kg)  Resp 16  SpO2 96 %           Assessment & Plan: Weight management  Advised patient to purchase a scale and bring it to the clinic on her next visit.  Prescription for phentermine was sent to pharmacy.  Follow-up 1 month.

## 2023-10-30 NOTE — Progress Notes (Signed)
Here for follow up taking Phentermine and stated no scales at home.  Notes likely 1-2 lb weight gain and noted on our scales 2lb.  Stated tolerated med well.

## 2023-10-31 LAB — CMP12+LP+TP+TSH+6AC+CBC/D/PLT
ALT: 16 [IU]/L (ref 0–32)
AST: 22 [IU]/L (ref 0–40)
Albumin: 4.6 g/dL (ref 3.8–4.9)
Alkaline Phosphatase: 93 [IU]/L (ref 44–121)
BUN/Creatinine Ratio: 24 (ref 12–28)
BUN: 13 mg/dL (ref 8–27)
Basophils Absolute: 0 10*3/uL (ref 0.0–0.2)
Basos: 0 %
Bilirubin Total: 0.2 mg/dL (ref 0.0–1.2)
Calcium: 9.1 mg/dL (ref 8.7–10.3)
Chloride: 98 mmol/L (ref 96–106)
Chol/HDL Ratio: 3.5 {ratio} (ref 0.0–4.4)
Cholesterol, Total: 177 mg/dL (ref 100–199)
Creatinine, Ser: 0.55 mg/dL — ABNORMAL LOW (ref 0.57–1.00)
EOS (ABSOLUTE): 0.2 10*3/uL (ref 0.0–0.4)
Eos: 2 %
Estimated CHD Risk: 0.6 times avg. (ref 0.0–1.0)
Free Thyroxine Index: 2.3 (ref 1.2–4.9)
GGT: 11 [IU]/L (ref 0–60)
Globulin, Total: 2.7 g/dL (ref 1.5–4.5)
Glucose: 103 mg/dL — ABNORMAL HIGH (ref 70–99)
HDL: 50 mg/dL (ref >39)
Hematocrit: 43 % (ref 34.0–46.6)
Hemoglobin: 15 g/dL (ref 11.1–15.9)
Immature Grans (Abs): 0 10*3/uL (ref 0.0–0.1)
Immature Granulocytes: 0 %
Iron: 71 ug/dL (ref 27–159)
LDH: 209 [IU]/L (ref 119–226)
LDL Chol Calc (NIH): 104 mg/dL — ABNORMAL HIGH (ref 0–99)
Lymphocytes Absolute: 2.4 10*3/uL (ref 0.7–3.1)
Lymphs: 33 %
MCH: 32.5 pg (ref 26.6–33.0)
MCHC: 34.9 g/dL (ref 31.5–35.7)
MCV: 93 fL (ref 79–97)
Monocytes Absolute: 0.6 10*3/uL (ref 0.1–0.9)
Monocytes: 8 %
Neutrophils Absolute: 4.2 10*3/uL (ref 1.4–7.0)
Neutrophils: 57 %
Phosphorus: 3.7 mg/dL (ref 3.0–4.3)
Platelets: 264 10*3/uL (ref 150–450)
Potassium: 4.1 mmol/L (ref 3.5–5.2)
RBC: 4.61 x10E6/uL (ref 3.77–5.28)
RDW: 11.9 % (ref 11.7–15.4)
Sodium: 139 mmol/L (ref 134–144)
T3 Uptake Ratio: 28 % (ref 24–39)
T4, Total: 8.3 ug/dL (ref 4.5–12.0)
TSH: 4.11 u[IU]/mL (ref 0.450–4.500)
Total Protein: 7.3 g/dL (ref 6.0–8.5)
Triglycerides: 127 mg/dL (ref 0–149)
Uric Acid: 3.3 mg/dL (ref 3.0–7.2)
VLDL Cholesterol Cal: 23 mg/dL (ref 5–40)
WBC: 7.4 10*3/uL (ref 3.4–10.8)
eGFR: 105 mL/min/{1.73_m2} (ref >59)

## 2023-10-31 LAB — MICROALBUMIN / CREATININE URINE RATIO
Creatinine, Urine: 90.4 mg/dL
Microalb/Creat Ratio: 6 mg/g{creat} (ref 0–29)
Microalbumin, Urine: 5.7 ug/mL

## 2023-10-31 LAB — HGB A1C W/O EAG: Hgb A1c MFr Bld: 6.8 % — ABNORMAL HIGH (ref 4.8–5.6)

## 2023-11-06 ENCOUNTER — Encounter: Payer: Self-pay | Admitting: Physician Assistant

## 2023-11-06 ENCOUNTER — Ambulatory Visit: Payer: Self-pay | Admitting: Physician Assistant

## 2023-11-06 VITALS — BP 139/71 | HR 116 | Resp 16 | Ht 62.0 in | Wt 162.0 lb

## 2023-11-06 DIAGNOSIS — Z0000 Encounter for general adult medical examination without abnormal findings: Secondary | ICD-10-CM

## 2023-11-06 NOTE — Progress Notes (Signed)
 Pt presents today to complete physical, Pt didn't voice any concerns at this time Gretel Acre

## 2023-11-06 NOTE — Progress Notes (Signed)
[facility] ____________________________________________   None    (approximate)  I have reviewed the triage vital signs and the nursing notes.   HISTORY  Chief Complaint No chief complaint on file.   HPI Ellen Butler is a 63 y.o. 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 Authorizing Provider  albuterol (VENTOLIN HFA) 108 (90 Base) MCG/ACT inhaler Inhale 2 puffs into the lungs every 6 (six) hours as needed for wheezing or shortness of breath. 03/06/23   Joni Reining, PA-C  ALPRAZolam Prudy Feeler) 0.25 MG tablet Take 0.25 mg by mouth daily as needed. Takes prn 11/08/19   [provider]  blood glucose meter kit and supplies KIT Dispense based on patient and insurance preference. Use up to four times daily as directed. (FOR ICD-9 250.00, 250.01). 12/27/19   Joni Reining, PA-C  Blood Glucose Monitoring Suppl (ACCU-CHEK AVIVA PLUS) w/Device KIT 1 Device by Does not apply  route in the morning, at noon, and at bedtime. 08/09/21   Joni Reining, PA-C  Buprenorphine HCl-Naloxone HCl 5.7-1.4 MG SUBL Place under the tongue.    [provider]  glucose blood test strip As directed 3 times a day 08/09/21   Joni Reining, PA-C  Lancets (ACCU-CHEK MULTICLIX) lancets Use as instructed 08/09/21   Joni Reining, PA-C  lidocaine (LIDODERM) 5 % Place 1 patch onto the skin daily. Remove & Discard patch within 12 hours or as directed by MD Patient not taking: Reported on 10/30/2023 07/17/22   Joni Reining, PA-C  metFORMIN (GLUCOPHAGE-XR) 500 MG 24 hr tablet Take 2 tablets (1,000 mg total) by mouth 2 (two) times daily. 05/05/23   Joni Reining, PA-C  phentermine 37.5 MG capsule Take 1 capsule (37.5 mg total) by mouth every morning. 10/30/23   Joni Reining, PA-C    Allergies Patient has no known allergies.  Family History  Problem Relation Age of Onset   Parkinson's disease Mother    Diabetes Mother    Arthritis Mother    Diabetes Maternal Aunt     Social History Social History   Tobacco Use   Smoking status: Every Day   Smokeless tobacco: Never    Review of Systems Constitutional: No fever/chills Eyes: No visual changes. ENT: No sore throat. Cardiovascular: Denies chest pain. Respiratory: Denies shortness of breath. Gastrointestinal: No abdominal pain.  No nausea, no vomiting.  No diarrhea.  No constipation. Genitourinary: Negative for dysuria. Musculoskeletal: Negative for back pain. Skin: Negative for rash. Neurological: Negative for headaches, focal weakness or numbness. Psychiatric: Anxiety Endocrine: Diabetes  ____________________________________________   PHYSICAL EXAM:  VITAL SIGNS: BP 139/71  Cuff Size Normal  Pulse Rate 116Pulse Rate. 116. Data is abnormal. Taken on 11/06/23 8:26 AM  Weight 162 lb (73.5 kg)  Height 5\' 2"  (1.575 m)  Resp 16  SpO2 97 %   Constitutional: Alert and oriented. Well appearing and in no acute  distress. Eyes: Conjunctivae are normal. PERRL. EOMI. Head: Atraumatic. Nose: No congestion/rhinnorhea. Mouth/Throat: Mucous membranes are moist.  Oropharynx non-erythematous. Neck: No stridor.  No cervical spine tenderness to palpation. Hematological/Lymphatic/Immunilogical: No cervical lymphadenopathy. Cardiovascular: Normal rate, regular rhythm. Grossly normal heart sounds.  Good peripheral circulation. Respiratory: Normal respiratory effort.  No retractions. Lungs CTAB. Gastrointestinal: Soft and nontender. No distention. No abdominal bruits. No CVA tenderness. Genitourinary: Deferred Musculoskeletal: No lower extremity tenderness nor edema.  No joint effusions. Neurologic:  Normal speech and language. No gross focal neurologic deficits are appreciated. No gait instability. Skin:  Skin is warm, dry and intact. No rash noted. Psychiatric: Mood and affect are normal. Speech and behavior are normal.  ____________________________________________   LABS          Component Ref Range & Units (hover) 7 d ago 8 mo ago 1 yr ago 2 yr ago 3 yr ago 4 yr ago  Color, UA yellow yellow yellow Yellow Yellow Yellow  Clarity, UA clear clear cloudy Clear Clear Clear  Glucose, UA Negative Negative Negative Negative Negative Positive Abnormal  CM  Bilirubin, UA neg neg negative Negative Negative Negative  Ketones, UA neg neg negative Negative Negative Negative  Spec Grav, UA 1.010 1.020 1.010 1.015 1.015 1.025  Blood, UA neg neg negative Negative Negative Negative  pH, UA 7.5 6.0 7.0 7.0 7.0 6.0  Protein, UA Positive Abnormal  Negative Negative Positive Abnormal  CM Negative Negative  Comment: trace -+  Urobilinogen, UA 0.2 0.2 0.2 0.2 0.2 0.2  Nitrite, UA neg neg negative Negative Negative Negative  Leukocytes, UA Negative Negative Negative Negative Negative Negative  Appearance  dark      Odor                   View All Conversations on this Encounter              Component Ref Range &  Units (hover) 7 d ago (10/30/23) 8 mo ago (02/20/23) 1 yr ago (08/06/22) 1 yr ago (01/23/22) 2 yr ago (08/07/21) 2 yr ago (05/04/21) 2 yr ago (01/18/21)  Glucose 103 High  129 High   138 High    238 High  R  Uric Acid 3.3 3.7 CM  3.9 CM   3.4 CM  Comment:            Therapeutic target for gout patients: <6.0  BUN 13 19 R  10 R   14 R  Creatinine, Ser 0.55 Low  0.67  0.66   0.70  eGFR 105 101  102   101  BUN/Creatinine Ratio 24 28 High  R  15 R   20 R  Sodium 139 142  145 High    136  Potassium 4.1 4.5  4.5   4.5  Chloride 98 102  101   98  Calcium 9.1 9.6 R  10.0 R   9.3 R  Phosphorus 3.7 3.8  3.5   3.3  Total Protein 7.3 6.8  7.3   7.0  Albumin 4.6 4.6  5.0 High    4.3  Globulin, Total 2.7 2.2  2.3   2.7  Bilirubin Total 0.2 0.3  0.4   0.3  Alkaline Phosphatase 93 76  116   117  LDH 209 187  230 High    202  AST 22 19  21   20   ALT 16 16  19   26   GGT 11 10  16   16   Iron 71 93  108   103  Cholesterol, Total 177 194 166 251 High  180 168 197  Triglycerides 127 154 High  124 215 High  129 208 High  295 High   HDL 50 46 54 54 51 41 42  VLDL Cholesterol Cal 23 27 22  39 23 35 50 High   LDL Chol Calc (NIH) 104 High  121 High  90 158 High  106 High  92 105 High   Chol/HDL Ratio 3.5 4.2 CM 3.1 CM 4.6 High  CM 3.5 CM 4.1 CM 4.7 High  CM  Comment:                                   T. Chol/HDL Ratio                                             Men  Women                               1/2 Avg.Risk  3.4    3.3                                   Avg.Risk  5.0    4.4                                2X Avg.Risk  9.6    7.1                                3X Avg.Risk 23.4   11.0  Estimated CHD Risk 0.6 1.0 CM  1.2 High  CM   1.2 High  CM  Comment: The CHD Risk is based on the T. Chol/HDL ratio. Other factors affect CHD Risk such as hypertension, smoking, diabetes, severe obesity, and family history of premature CHD.  TSH 4.110 3.760  4.590 High    2.760  T4, Total 8.3 7.2  7.8   7.8  T3 Uptake  Ratio 28 28  26   31   Free Thyroxine Index 2.3 2.0  2.0   2.4  WBC 7.4 8.5  8.1   8.8  RBC 4.61 4.36  4.95   4.86  Hemoglobin 15.0 13.8  15.9   15.3  Hematocrit 43.0 40.4  45.4   43.9  MCV 93 93  92   90  MCH 32.5 31.7  32.1   31.5  MCHC 34.9 34.2  35.0   34.9  RDW 11.9 12.3  12.3   12.2  Platelets 264 260  267   252  Neutrophils 57 60  64   61  Lymphs 33 28  27   29   Monocytes 8 8  6   7   Eos 2 3  2   2   Basos 0 1  1   1   Neutrophils Absolute 4.2 5.1  5.1   5.4  Lymphocytes Absolute 2.4 2.4  2.2   2.5  Monocytes Absolute 0.6 0.7  0.5   0.6  EOS (ABSOLUTE) 0.2 0.2  0.2   0.2  Basophils Absolute 0.0 0.1  0.0   0.1  Immature Granulocytes 0 0  0   0  Immature Grans (Abs) 0.0 0.0  0.0   0.0  Resulting Agency [facility]          View All Conversations on this Encounter       Back to Top Diabetic kidney evaluation - eGFR measurement (Yearly)  Next due on 10/29/2024  Address Topic     Contains abnormal data Hgb A1c w/o eAG Order: [id number]  Status: Final result      Next appt: None      Dx: Type 2 diabetes mellitus with hypergl...      Test Result Released: Yes (not seen)    0 Result Notes      1 HM Topic            Component Ref Range & Units (hover) 7 d ago (10/30/23) 8 mo ago (02/20/23) 1 yr ago (08/06/22) 1 yr ago (07/17/22) 1 yr ago (01/23/22) 2 yr ago (08/07/21) 2 yr ago (05/04/21)  Hgb A1c MFr Bld 6.8 High  6.7 High  CM 6.8 High  CM 6.1 Abnormal  VC, R 6.8 High  CM 7.1 High  CM 7.8 High  CM  Comment:          Prediabetes: 5.7 - 6.4          Diabetes: >6.4          Glycemic control for adults with diabetes: <7.0  HbA1c POC (<> result, manual entry)    VC     Resulting Agency [facility]         Narrative Performed by: Verdell Carmine Performed at:  [address] [facility] [address] [place], [place]  [id number] Lab Director: Jolene Schimke MD, Phone:  [phone]   Specimen Collected: 10/30/23 08:54 Last Resulted: 10/31/23 15:09       View All Conversations on this Encounter     Back to Top HEMOGLOBIN A1C (Every 6 Months)  Next due on 04/28/2024  Address Topic    Microalbumin / creatinine urine ratio Order: [id number]           Component Ref Range & Units (hover) 7 d ago 1 yr ago 2 yr ago 4 yr ago  Creatinine, Urine 90.4 33.6 133.2 97.2  Microalbumin, Urine 5.7 <3.0 CM 11.0 7.7          ____________________________________________  EKG Sinus rhythm at 77 bpm  ____________________________________________    ____________________________________________   INITIAL IMPRESSION / ASSESSMENT AND PLAN  As part of my medical decision making, I reviewed the following data within the electronic MEDICAL RECORD NUMBER      No acute findings on physical exam, EKG, or labs.        ____________________________________________          Note:  This document was prepared using Conservation officer, historic buildings and may include unintentional  dictation errors.

## 2023-11-26 ENCOUNTER — Other Ambulatory Visit: Payer: Self-pay | Admitting: Physician Assistant

## 2023-11-26 MED ORDER — PHENTERMINE HCL 37.5 MG PO CAPS: 37.5000 mg | ORAL_CAPSULE | ORAL | 0 refills | Status: DC

## 2023-11-27 ENCOUNTER — Ambulatory Visit: Payer: Self-pay | Admitting: Physician Assistant

## 2023-12-24 ENCOUNTER — Ambulatory Visit: Payer: Self-pay | Admitting: Physician Assistant

## 2023-12-25 ENCOUNTER — Ambulatory Visit: Payer: Self-pay | Admitting: Physician Assistant

## 2023-12-25 ENCOUNTER — Encounter: Payer: Self-pay | Admitting: Physician Assistant

## 2023-12-25 VITALS — BP 122/68 | HR 106 | Temp 98.0°F | Resp 16 | Ht 62.0 in | Wt 159.0 lb

## 2023-12-25 DIAGNOSIS — Z7689 Persons encountering health services in other specified circumstances: Secondary | ICD-10-CM

## 2023-12-25 DIAGNOSIS — E663 Overweight: Secondary | ICD-10-CM | POA: Insufficient documentation

## 2023-12-25 MED ORDER — PHENTERMINE HCL 37.5 MG PO CAPS: 37.5000 mg | ORAL_CAPSULE | ORAL | 0 refills | Status: DC

## 2023-12-25 NOTE — Progress Notes (Signed)
 Pt presents today for weight check. Pt weighs in at 159lbs. Previous weight 162lbs

## 2023-12-25 NOTE — Progress Notes (Signed)
   Subjective: Weight management    Patient ID: Ellen Butler, female    DOB: Oct 03, 1961, 63 y.o.   MRN: [id number]  HPI Patient presents for weight management.  Patient is lost 12 pounds since starting phentermine In October 2024.  Review of Systems COPD and diabetes.    Objective:   Physical Exam BP 122/68  Cuff Size Normal  Pulse Rate 106Pulse Rate. 106. Data is abnormal. Taken on 12/25/23 8:59 AM  Temp 98 F (36.7 C)  Temp Source Temporal  Weight 159 lb (72.1 kg)  Height 5\' 2"  (1.575 m)  Resp 16  SpO2 96 %         Assessment & Plan: Weight management  Patient will continue medication follow-up in 2 months.

## 2024-01-21 ENCOUNTER — Other Ambulatory Visit: Payer: Self-pay

## 2024-01-21 DIAGNOSIS — M25669 Stiffness of unspecified knee, not elsewhere classified: Secondary | ICD-10-CM | POA: Insufficient documentation

## 2024-01-21 DIAGNOSIS — M23329 Other meniscus derangements, posterior horn of medial meniscus, unspecified knee: Secondary | ICD-10-CM | POA: Insufficient documentation

## 2024-01-21 DIAGNOSIS — E663 Overweight: Secondary | ICD-10-CM

## 2024-01-21 DIAGNOSIS — S8000XA Contusion of unspecified knee, initial encounter: Secondary | ICD-10-CM | POA: Insufficient documentation

## 2024-01-21 DIAGNOSIS — M2240 Chondromalacia patellae, unspecified knee: Secondary | ICD-10-CM | POA: Insufficient documentation

## 2024-01-21 DIAGNOSIS — T84039A Mechanical loosening of unspecified internal prosthetic joint, initial encounter: Secondary | ICD-10-CM | POA: Insufficient documentation

## 2024-01-21 DIAGNOSIS — R609 Edema, unspecified: Secondary | ICD-10-CM | POA: Insufficient documentation

## 2024-01-21 DIAGNOSIS — M179 Osteoarthritis of knee, unspecified: Secondary | ICD-10-CM | POA: Insufficient documentation

## 2024-01-21 DIAGNOSIS — R262 Difficulty in walking, not elsewhere classified: Secondary | ICD-10-CM | POA: Insufficient documentation

## 2024-01-21 DIAGNOSIS — M6281 Muscle weakness (generalized): Secondary | ICD-10-CM | POA: Insufficient documentation

## 2024-01-21 DIAGNOSIS — M25469 Effusion, unspecified knee: Secondary | ICD-10-CM | POA: Insufficient documentation

## 2024-01-21 DIAGNOSIS — R269 Unspecified abnormalities of gait and mobility: Secondary | ICD-10-CM | POA: Insufficient documentation

## 2024-01-21 DIAGNOSIS — M1991 Primary osteoarthritis, unspecified site: Secondary | ICD-10-CM | POA: Insufficient documentation

## 2024-01-21 MED ORDER — PHENTERMINE HCL 37.5 MG PO CAPS: 37.5000 mg | ORAL_CAPSULE | ORAL | 0 refills | Status: DC

## 2024-02-19 ENCOUNTER — Ambulatory Visit: Payer: Self-pay | Admitting: Physician Assistant

## 2024-02-19 ENCOUNTER — Encounter: Payer: Self-pay | Admitting: Physician Assistant

## 2024-02-19 VITALS — BP 130/82 | HR 107 | Resp 16 | Ht 62.0 in | Wt 162.2 lb

## 2024-02-19 DIAGNOSIS — E663 Overweight: Secondary | ICD-10-CM

## 2024-02-19 DIAGNOSIS — Z0184 Encounter for antibody response examination: Secondary | ICD-10-CM

## 2024-02-19 MED ORDER — PHENTERMINE HCL 37.5 MG PO CAPS: 37.5000 mg | ORAL_CAPSULE | ORAL | 0 refills | Status: DC

## 2024-02-19 NOTE — Progress Notes (Signed)
   Subjective: Weight management    Patient ID: Ellen Butler, female    DOB: 05-07-1961, 63 y.o.   MRN: [id number]  HPI Patient presents for weight management.  Patient is currently taking phentermine  and voices no complaints of medication.  This is the first time the patient has weight gain since starting medication in October 2024.   Review of Systems COPD and diabetes.    Objective:   Physical Exam BP 130/82  Pulse Rate 107Pulse Rate. 107. Data is abnormal. Taken on 02/19/24 9:36 AM  Weight 162 lb 3.2 oz (73.6 kg)  Height 5\' 2"  (1.575 m)  Resp 16  SpO2 95 %  Physical exam was deferred.       Assessment & Plan: Weight management.   Advised to monitor calorie intake and increase physical activities.  Patient will follow-up in 1 month.

## 2024-02-19 NOTE — Progress Notes (Signed)
 Pt presents today for weight check 162lb. Previous weight 159lb

## 2024-02-20 LAB — HEPATITIS B SURFACE ANTIBODY,QUALITATIVE: Hep B Surface Ab, Qual: NONREACTIVE

## 2024-02-23 ENCOUNTER — Other Ambulatory Visit: Payer: Self-pay | Admitting: Physician Assistant

## 2024-02-23 DIAGNOSIS — E1165 Type 2 diabetes mellitus with hyperglycemia: Secondary | ICD-10-CM

## 2024-03-18 ENCOUNTER — Ambulatory Visit: Payer: Self-pay | Admitting: Physician Assistant

## 2024-03-18 ENCOUNTER — Encounter: Payer: Self-pay | Admitting: Physician Assistant

## 2024-03-18 VITALS — BP 136/79 | HR 98 | Temp 98.2°F | Resp 16 | Wt 162.0 lb

## 2024-03-18 DIAGNOSIS — E663 Overweight: Secondary | ICD-10-CM

## 2024-03-18 DIAGNOSIS — Z7689 Persons encountering health services in other specified circumstances: Secondary | ICD-10-CM

## 2024-03-18 MED ORDER — PHENTERMINE HCL 37.5 MG PO CAPS: 37.5000 mg | ORAL_CAPSULE | ORAL | 0 refills | Status: DC

## 2024-03-18 NOTE — Progress Notes (Signed)
   Subjective: Weight management    Patient ID: Ellen Butler, female    DOB: 19-May-1961, 63 y.o.   MRN: [id number]  HPI Patient presents for weight management.  Patient continues with phentermine .  No weight loss in the past month.  Patient state measurements reveal decreased in weights and abdomen circumference.  Patient admits to no diet or exercise.   Review of Systems Diabetes and COPD.    Objective:   Physical Exam   03/18/2024   9:08 AM    BP 136/79  Pulse Rate 98  Temp 98.2 F (36.8 C)  Weight 162 lb (73.5 kg)  Resp 16  SpO2 94 %        Assessment & Plan: Weight management.  Discussed adding dieting and exercise to her regimen.  Discussed patient may have reached a plateau with this medication.  Will continue medication for 1 month follow-up.

## 2024-03-18 NOTE — Progress Notes (Signed)
 Stated doing well with Phentermine  with hydration and weight the same this month, but stated she's losing inches each month and clothes are looser.  Desires to con't weight loss plan.

## 2024-04-08 ENCOUNTER — Ambulatory Visit: Payer: Self-pay | Admitting: Physician Assistant

## 2024-04-08 ENCOUNTER — Encounter: Payer: Self-pay | Admitting: Physician Assistant

## 2024-04-08 DIAGNOSIS — E663 Overweight: Secondary | ICD-10-CM

## 2024-04-08 MED ORDER — PHENTERMINE HCL 37.5 MG PO CAPS: 37.5000 mg | ORAL_CAPSULE | ORAL | 0 refills | Status: DC

## 2024-04-08 NOTE — Progress Notes (Signed)
 States [place] was 159 yesterday

## 2024-04-08 NOTE — Progress Notes (Signed)
   Subjective: Weight management    Patient ID: Ellen Butler, female    DOB: [birth year], 63 y.o.   MRN: [id number]  HPI Patient presents for weight management.  Patient continues phentermine  at 37.5 mg.  Patient reported 1 pound weight loss.  Believe patient has plateaued with this medication.  Review of Systems COPD    Objective:   Physical Exam BP 125/80  BP Location Left Arm  Patient Position Sitting  Cuff Size Normal  Pulse Rate 119  Temp 97.8 F (36.6 C)  Temp Source Temporal  Weight 161 lb (73 kg)  Height 5' 2 (1.575 m)  Resp 14  SpO2 96 %         Assessment & Plan: Weight management  Discussed with patient to consider Ozempic because she has plateaued with the phentermine .  Patient states she needs time to consider taking these weekly injections.  Will follow-up next month.

## 2024-04-19 ENCOUNTER — Other Ambulatory Visit: Payer: Self-pay

## 2024-04-19 DIAGNOSIS — E663 Overweight: Secondary | ICD-10-CM

## 2024-04-30 ENCOUNTER — Other Ambulatory Visit: Payer: Self-pay

## 2024-04-30 DIAGNOSIS — E663 Overweight: Secondary | ICD-10-CM

## 2024-04-30 NOTE — Telephone Encounter (Signed)
>>>   Ellen Butler - >>>  >>> Ellen Butler was suppose to call in a refill for the Phenternine pills and 3 refills - to get me to my September appointment. >>> Then he mentioned ozempic shots - and said to think about it . >>> I have and I don't want to do that right now . >>> So he thought I might and sent script with no refills . >>> Can we get that changed so I have enough to get to my September appointment. >>>  >>> Maybe if this doesn't work we can revisit something different in September . But this is working now . >>>  >>> Thank u so much.

## 2024-05-03 ENCOUNTER — Other Ambulatory Visit: Payer: Self-pay | Admitting: Physician Assistant

## 2024-05-03 MED ORDER — PHENTERMINE HCL 37.5 MG PO CAPS: 37.5000 mg | ORAL_CAPSULE | ORAL | 2 refills | Status: DC

## 2024-05-17 ENCOUNTER — Telehealth: Payer: Self-pay

## 2024-05-17 NOTE — Telephone Encounter (Signed)
 Error

## 2024-07-05 ENCOUNTER — Ambulatory Visit: Payer: Self-pay | Admitting: Physician Assistant

## 2024-07-05 ENCOUNTER — Encounter: Payer: Self-pay | Admitting: Physician Assistant

## 2024-07-05 VITALS — BP 128/71 | HR 112 | Temp 97.6°F | Resp 18 | Ht 62.0 in | Wt 155.0 lb

## 2024-07-05 DIAGNOSIS — J189 Pneumonia, unspecified organism: Secondary | ICD-10-CM

## 2024-07-05 MED ORDER — PSEUDOEPH-BROMPHEN-DM 30-2-10 MG/5ML PO SYRP: 5.0000 mL | ORAL_SOLUTION | Freq: Four times a day (QID) | ORAL | 0 refills | Status: DC | PRN

## 2024-07-05 MED ORDER — AZITHROMYCIN 250 MG PO TABS
ORAL_TABLET | ORAL | 0 refills | Status: AC
Start: 2024-07-05 — End: 2024-07-10

## 2024-07-05 NOTE — Progress Notes (Signed)
 Chest cold since 06/29/24 with coughing and coughing turned worse the next day. Coughing up thick mucus and having some wheezing. Pt has been havning coughing fits.  Pt taking tylenol and ibuprofen but nothing for mucus or cough, pt states she didn't know what to take.

## 2024-07-05 NOTE — Progress Notes (Signed)
   Subjective: Cough and chest congestion    Patient ID: Ellen Butler, female    DOB: Oct 23, 1960, 63 y.o.   MRN: [id number]  HPI Patient complains of 6 days of cough and chest congestion. Denies fever/chill, or NVD. No recent travel or known contact with Covid-19. Patient smokes.   Review of Systems COPD, Diabetes, and osteoarthritis    Objective:   Physical Exam  07/05/2024   9:54 AM    BP 128/71  Cuff Size Normal  Pulse Rate 112  Temp 97.6 F (36.4 C)  Temp Source Temporal  Weight 155 lb (70.3 kg)  Height 5' 2 (1.575 m)  Resp 18  SpO2 94 %   Other Vitals   BMI: 28.35 kg/m2  BSA: 1.75 m2     NAD. HEENT unremarkable Neck supple Lungs with bilateral Rales Heart tachy at 112 BPM    Assessment & Plan:Pneumonia  Z-pack and Bromfed DM.  Follow up 48 hours if no improvement.

## 2024-07-08 ENCOUNTER — Ambulatory Visit: Payer: Self-pay | Admitting: Physician Assistant

## 2024-07-23 ENCOUNTER — Other Ambulatory Visit: Payer: Self-pay | Admitting: Physician Assistant

## 2024-08-09 ENCOUNTER — Ambulatory Visit: Payer: Self-pay | Admitting: Physician Assistant

## 2024-08-17 ENCOUNTER — Ambulatory Visit: Payer: Self-pay | Admitting: Physician Assistant

## 2024-08-17 ENCOUNTER — Other Ambulatory Visit: Admitting: Physician Assistant

## 2024-08-17 ENCOUNTER — Encounter: Payer: Self-pay | Admitting: Physician Assistant

## 2024-08-17 VITALS — BP 141/74 | HR 123 | Ht 62.0 in | Wt 159.6 lb

## 2024-08-17 DIAGNOSIS — Z7689 Persons encountering health services in other specified circumstances: Secondary | ICD-10-CM

## 2024-08-17 MED ORDER — PHENTERMINE HCL 37.5 MG PO CAPS: 37.5000 mg | ORAL_CAPSULE | ORAL | 0 refills | Status: DC

## 2024-08-17 NOTE — Progress Notes (Signed)
   Subjective: Weight management    Patient ID: Ellen Butler, female    DOB: 19-Dec-1960, 63 y.o.   MRN: [id number]  HPI  Patient presents for weight management with continued use of for phentermine  at 37.5 mg.  Patient weight has stabilized  Review of Systems COPD, diabetes, and osteoarthritis.    Objective:   Physical Exam Patient weight circumference is 39.5.  Hip circumference is 40.5. BP 141/74  Cuff Size Normal  Pulse Rate 123 retook pulse manage 113 bpm.  Weight 159 lb 9.6 oz (72.4 kg)  Height 5' 2 (1.575 m)        Other Vitals   BMI: 29.19 kg/m2  BSA: 1.78 m2   Assessment & Plan: Weight management   Continue phentermine  and follow-up monthly.

## 2024-08-17 NOTE — Progress Notes (Signed)
 Pt presents today for weight check. Pt previous weight 155lb. Today pt weighs in at 159.6lb.

## 2024-09-14 ENCOUNTER — Ambulatory Visit: Payer: Self-pay | Admitting: Physician Assistant

## 2024-09-14 ENCOUNTER — Encounter: Payer: Self-pay | Admitting: Physician Assistant

## 2024-09-14 VITALS — BP 149/85 | HR 108 | Resp 16 | Wt 164.0 lb

## 2024-09-14 DIAGNOSIS — Z7689 Persons encountering health services in other specified circumstances: Secondary | ICD-10-CM

## 2024-09-14 MED ORDER — OZEMPIC (0.25 OR 0.5 MG/DOSE) 2 MG/3ML ~~LOC~~ SOPN: 0.2500 mg | PEN_INJECTOR | SUBCUTANEOUS | 0 refills | Status: DC

## 2024-09-14 NOTE — Progress Notes (Signed)
   Subjective: Weight management    Patient ID: Ellen Butler, female    DOB: 1961-07-10, 63 y.o.   MRN: [id number]  HPI Patient presents for reevaluation weight management clinic for phentermine .  Patient weight plateaued 2 months ago and has increased 6 pounds from last month's visit.   Review of Systems COPD and diabetes    Objective:   Physical Exam BP 149/85  Pulse Rate 108  Weight 164 lb (74.4 kg)  Resp 16  SpO2 96 %         Assessment & Plan: Weight management   Advised patient she has plateaued with phentermine .  Patient is amenable to a trial of Ozempic.  Prescription sent to pharmacy.  Follow-up in 1 month.

## 2024-09-14 NOTE — Progress Notes (Signed)
 Reports using the Phentermine  and wishes to continue, but not showing weight loss.  Waist measures 37 today.

## 2024-10-19 ENCOUNTER — Encounter: Payer: Self-pay | Admitting: Physician Assistant

## 2024-10-19 VITALS — BP 144/84 | HR 110 | Temp 97.0°F | Resp 14 | Ht 62.0 in | Wt 163.0 lb

## 2024-10-19 DIAGNOSIS — Z7689 Persons encountering health services in other specified circumstances: Secondary | ICD-10-CM

## 2024-10-19 MED ORDER — OZEMPIC (0.25 OR 0.5 MG/DOSE) 2 MG/3ML ~~LOC~~ SOPN: 0.5000 mg | PEN_INJECTOR | SUBCUTANEOUS | 0 refills | Status: DC

## 2024-10-19 NOTE — Progress Notes (Signed)
 Presents today for 1 month weight check. States I haven't lost any weight this month.  Has lost some inches - reports clothes fitting better.  O2 sats from 93% to 97% after taking deep breaths.  Pulse anywhere from 110-119  Respiratory issues - achy, woozy, dizzy Nose stopped up Occ productive cough - white phlegm Denies SOB States feel better today than I did yesterday - took a lot of Vitamin C  Current smoker

## 2024-10-19 NOTE — Progress Notes (Signed)
" ° °  Subjective: Weight management    Patient ID: Ellen Butler, female    DOB: [birth year], 64 y.o.   MRN: [id number]  HPI Patient is follow-up for weight management status post starting Ozempic  at 0.25 mg.   Review of Systems     Objective:   Physical Exam Weight 163 lb (73.9 kg)  Height 5' 2 (1.575 m)     BMI: 29.81 kg/m2  BSA: 1.80 m2      Assessment & Plan: Weight management   Increase Ozempic  to 0.5 mg subcu weekly.  Follow-up 1 month. "

## 2024-11-17 ENCOUNTER — Ambulatory Visit: Payer: Self-pay

## 2024-11-17 DIAGNOSIS — Z0000 Encounter for general adult medical examination without abnormal findings: Secondary | ICD-10-CM

## 2024-11-17 DIAGNOSIS — Z0184 Encounter for antibody response examination: Secondary | ICD-10-CM

## 2024-11-17 DIAGNOSIS — E1165 Type 2 diabetes mellitus with hyperglycemia: Secondary | ICD-10-CM

## 2024-11-17 LAB — POCT URINALYSIS DIPSTICK
Bilirubin, UA: NEGATIVE
Blood, UA: NEGATIVE
Glucose, UA: NEGATIVE
Ketones, UA: NEGATIVE
Leukocytes, UA: NEGATIVE
Nitrite, UA: NEGATIVE
Protein, UA: NEGATIVE
Spec Grav, UA: <=1.005 — AB
Urobilinogen, UA: 0.2 U/dL
pH, UA: 7.5

## 2024-11-18 ENCOUNTER — Ambulatory Visit: Payer: Self-pay | Admitting: Physician Assistant

## 2024-11-18 ENCOUNTER — Encounter: Payer: Self-pay | Admitting: Physician Assistant

## 2024-11-18 VITALS — BP 131/73 | HR 103 | Temp 98.0°F | Resp 12 | Ht 62.0 in | Wt 165.0 lb

## 2024-11-18 DIAGNOSIS — Z7689 Persons encountering health services in other specified circumstances: Secondary | ICD-10-CM

## 2024-11-18 DIAGNOSIS — Z0000 Encounter for general adult medical examination without abnormal findings: Secondary | ICD-10-CM

## 2024-11-18 MED ORDER — TIRZEPATIDE 2.5 MG/0.5ML ~~LOC~~ SOAJ: 2.5000 mg | SUBCUTANEOUS | 0 refills | Status: AC

## 2024-11-18 MED ORDER — DEXCOM G7 15 DAY SENSOR MISC: 1.0000 | 3 refills | Status: AC

## 2024-11-18 NOTE — Progress Notes (Signed)
 Was taking a steroid dose pack late January 2026.  Concerned about elevated glucose levels. States checked BS last night at home & it was 300.  Not sure if she waited 2 hours after eating or drinking. Reminded Fasting BS are 1st thing int he morning & throughout the day should be at least 2 hours after eating/drinking.  Wants to discuss ozempic  vs mounjaro  for DM2.  Concerned because she hasn't lost any weight since starting Ozempic  & this month her weight went up.

## 2024-11-18 NOTE — Progress Notes (Signed)
 "  [facility] ____________________________________________   None    (approximate)  I have reviewed the triage vital signs and the nursing notes.   HISTORY  Chief Complaint Annual Exam and Medication Managment (Ozempic  vs Mounjaro )   HPI Ellen Butler is a 64 y.o. female patient presents for annual physical exam.  Patient states continue left shoulder pain secondary to fall.  Patient continue physical therapy.  Patient requests to discontinue Ozempic  and changed to Mounjaro .        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   Contusion of knee 01/21/2024   Derangement of posterior horn of medial meniscus 01/21/2024   Edema 01/21/2024   Knee joint effusion 01/21/2024   Knee stiff 01/21/2024   Localized, primary osteoarthritis 01/21/2024   Muscle weakness 01/21/2024   Osteoarthritis of knee 01/21/2024   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   Overweight (BMI 25.0-29.9) 12/25/2023   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 10/30/2023   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   Recurrent dislocation of knee 10/30/2023   Sprain of cruciate ligament of knee 10/30/2023   Postoperative pain 01/14/2023   Encounter for other preprocedural examination 11/22/2022   Pain in right shoulder 10/31/2022   Diabetes (HCC) 10/03/2020   Right leg pain 08/02/2015   Hx of substance abuse (HCC) 10/01/2014   Pain syndrome, chronic 10/01/2014   Peri-prosthetic femoral shaft fracture 09/23/2014   Chronic obstructive pulmonary disease, unspecified (HCC) 12/16/2013    Past  Surgical History:  Procedure Laterality Date   FEMUR FRACTURE SURGERY     REPLACEMENT TOTAL KNEE      Prior to Admission medications  Medication Sig Start Date End Date Taking? Authorizing Provider  albuterol  (VENTOLIN  HFA) 108 (90 Base) MCG/ACT inhaler Inhale 2 puffs into the lungs every 6 (six) hours as needed for wheezing or shortness of breath. 03/06/23  Yes Claudene Tanda POUR, PA-C  ALPRAZolam (XANAX) 0.25 MG tablet Take 0.25 mg by mouth daily as needed. Takes prn 11/08/19  Yes [provider]  blood glucose meter kit and supplies KIT Dispense based on patient and insurance preference. Use up to four times daily as directed. (FOR ICD-9 250.00, 250.01). 12/27/19  Yes Claudene Tanda POUR, PA-C  Buprenorphine HCl-Naloxone HCl 5.7-1.4 MG SUBL Place under the tongue.   Yes [provider]  glucose blood test strip As directed 3 times a day 08/09/21  Yes Claudene Tanda POUR, PA-C  Lancets (ACCU-CHEK MULTICLIX) lancets Use as instructed 08/09/21  Yes Claudene Tanda POUR, PA-C  metFORMIN  (GLUCOPHAGE -XR) 500 MG 24 hr tablet TAKE 2 TABLETS (1,000 MG TOTAL) BY MOUTH 2 (TWO) TIMES DAILY. 02/23/24  Yes Claudene Tanda POUR, PA-C  Continuous Glucose Sensor (DEXCOM G7 15 DAY SENSOR) MISC 1 Application by Does not apply route once a week. 11/18/24  Yes Claudene Tanda POUR, PA-C  tirzepatide  (MOUNJARO ) 2.5 MG/0.5ML Pen Inject 2.5 mg into the skin once a week. 11/18/24  Yes Claudene Tanda POUR, PA-C    Allergies Patient has no known allergies.  Family History  Problem Relation Age  of Onset   Parkinson's disease Mother    Diabetes Mother    Arthritis Mother    Diabetes Maternal Aunt     Social History Social History[1]  Review of Systems Constitutional: No fever/chills Eyes: No visual changes. ENT: No sore throat. Cardiovascular: Denies chest pain. Respiratory: Denies shortness of breath.  Obstructive sleep apnea Gastrointestinal: No abdominal pain.  No nausea, no vomiting.  No diarrhea.  No  constipation. Genitourinary: Negative for dysuria. Musculoskeletal: Negative for back pain. Skin: Negative for rash. Neurological: Negative for headaches, focal weakness or numbness. Endocrine: Diabetes   ____________________________________________   PHYSICAL EXAM:  VITAL SIGNS: BP 131/73  BP Location Left Arm  Patient Position Sitting  Cuff Size Large  Pulse Rate 103  Temp 98 F (36.7 C)  Temp Source Temporal  Weight 165 lb (74.8 kg)  Height 5' 2 (1.575 m)  Resp 12  SpO2 99 %   BMI: 30.18 kg/m2  BSA: 1.81 m2   Constitutional: Alert and oriented. Well appearing and in no acute distress. Eyes: Conjunctivae are normal. PERRL. EOMI. Head: Atraumatic. Nose: No congestion/rhinnorhea. Mouth/Throat: Mucous membranes are moist.  Oropharynx non-erythematous. Neck: No stridor. No cervical spine tenderness to palpation. Hematological/Lymphatic/Immunilogical: No cervical lymphadenopathy. Cardiovascular: Normal rate, regular rhythm. Grossly normal heart sounds.  Good peripheral circulation. Respiratory: Normal respiratory effort.  No retractions. Lungs CTAB. Gastrointestinal: Soft and nontender. No distention. No abdominal bruits. No CVA tenderness. Genitourinary: Deferred Musculoskeletal: No lower extremity tenderness nor edema.  No joint effusions. Neurologic:  Normal speech and language. No gross focal neurologic deficits are appreciated. No gait instability. Skin:  Skin is warm, dry and intact. No rash noted. Psychiatric: Mood and affect are normal. Speech and behavior are normal.  ____________________________________________   LABS ____________________________________________  EKG  Sinus tachycardia 102 bpm.  Left atrial enlargement. ____________________________________________   ____________________________________________     ____________________________________________   INITIAL IMPRESSION / ASSESSMENT AND PLAN   As part of my medical decision making, I  reviewed the following data within the electronic MEDICAL RECORD NUMBER      No acute findings on physical exam, labs, EKG.        ____________________________________________   FINAL CLINICAL IMPRESSION Annual physical exam.  Weight management.    ED Discharge Orders          Ordered    tirzepatide  (MOUNJARO ) 2.5 MG/0.5ML Pen  Weekly        11/18/24 1031    Continuous Glucose Sensor (DEXCOM G7 15 DAY SENSOR) MISC  Weekly        11/18/24 1031             Note:  This document was prepared using Dragon voice recognition software and may include unintentional dictation errors.     [1]  Social History Tobacco Use   Smoking status: Every Day   Smokeless tobacco: Never   "

## 2024-12-16 ENCOUNTER — Ambulatory Visit: Payer: Self-pay | Admitting: Physician Assistant
